# Patient Record
Sex: Male | Born: 1970 | Race: White | Hispanic: No | Marital: Single | Smoking: Current every day smoker
Health system: Southern US, Community
[De-identification: ages and names within clinical notes are randomized; demographics above are authoritative.]

## PROBLEM LIST (undated history)

## (undated) DIAGNOSIS — F39 Unspecified mood [affective] disorder: Secondary | ICD-10-CM

## (undated) DIAGNOSIS — F1910 Other psychoactive substance abuse, uncomplicated: Secondary | ICD-10-CM

## (undated) DIAGNOSIS — K5792 Diverticulitis of intestine, part unspecified, without perforation or abscess without bleeding: Secondary | ICD-10-CM

## (undated) DIAGNOSIS — F1410 Cocaine abuse, uncomplicated: Secondary | ICD-10-CM

## (undated) HISTORY — DX: Other psychoactive substance abuse, uncomplicated: F19.10

## (undated) HISTORY — DX: Cocaine abuse, uncomplicated: F14.10

## (undated) HISTORY — PX: COLOSTOMY: SHX63

---

## 1999-02-16 ENCOUNTER — Emergency Department (HOSPITAL_COMMUNITY): Admission: EM | Admit: 1999-02-16 | Discharge: 1999-02-16 | Payer: Self-pay | Admitting: Emergency Medicine

## 1999-10-07 ENCOUNTER — Emergency Department (HOSPITAL_COMMUNITY): Admission: EM | Admit: 1999-10-07 | Discharge: 1999-10-07 | Payer: Self-pay | Admitting: Emergency Medicine

## 2000-06-02 ENCOUNTER — Emergency Department (HOSPITAL_COMMUNITY): Admission: EM | Admit: 2000-06-02 | Discharge: 2000-06-02 | Payer: Self-pay | Admitting: Emergency Medicine

## 2004-08-14 ENCOUNTER — Ambulatory Visit (HOSPITAL_COMMUNITY): Admission: RE | Admit: 2004-08-14 | Discharge: 2004-08-14 | Payer: Self-pay | Admitting: Surgery

## 2004-10-11 ENCOUNTER — Encounter (INDEPENDENT_AMBULATORY_CARE_PROVIDER_SITE_OTHER): Payer: Self-pay | Admitting: Specialist

## 2004-10-11 ENCOUNTER — Inpatient Hospital Stay (HOSPITAL_COMMUNITY): Admission: RE | Admit: 2004-10-11 | Discharge: 2004-10-18 | Payer: Self-pay | Admitting: Surgery

## 2005-01-23 ENCOUNTER — Ambulatory Visit: Payer: Self-pay | Admitting: Internal Medicine

## 2005-09-04 ENCOUNTER — Emergency Department (HOSPITAL_COMMUNITY): Admission: EM | Admit: 2005-09-04 | Discharge: 2005-09-05 | Payer: Self-pay | Admitting: Emergency Medicine

## 2007-09-09 ENCOUNTER — Emergency Department (HOSPITAL_COMMUNITY): Admission: EM | Admit: 2007-09-09 | Discharge: 2007-09-09 | Payer: Self-pay | Admitting: Emergency Medicine

## 2008-11-12 ENCOUNTER — Emergency Department (HOSPITAL_BASED_OUTPATIENT_CLINIC_OR_DEPARTMENT_OTHER): Admission: EM | Admit: 2008-11-12 | Discharge: 2008-11-12 | Payer: Self-pay | Admitting: Emergency Medicine

## 2010-02-22 ENCOUNTER — Emergency Department (HOSPITAL_COMMUNITY): Admission: EM | Admit: 2010-02-22 | Discharge: 2010-02-22 | Payer: Self-pay | Admitting: Family Medicine

## 2010-06-27 ENCOUNTER — Ambulatory Visit: Payer: Self-pay | Admitting: Internal Medicine

## 2010-06-27 ENCOUNTER — Encounter (INDEPENDENT_AMBULATORY_CARE_PROVIDER_SITE_OTHER): Payer: Self-pay | Admitting: Family Medicine

## 2010-06-27 LAB — CONVERTED CEMR LAB
AST: 29 units/L (ref 0–37)
Albumin: 4.7 g/dL (ref 3.5–5.2)
Alkaline Phosphatase: 72 units/L (ref 39–117)
BUN: 12 mg/dL (ref 6–23)
Basophils Absolute: 0.1 10*3/uL (ref 0.0–0.1)
CO2: 28 meq/L (ref 19–32)
Chloride: 100 meq/L (ref 96–112)
Glucose, Bld: 93 mg/dL (ref 70–99)
HCT: 51 % (ref 39.0–52.0)
Hemoglobin: 16.3 g/dL (ref 13.0–17.0)
Hep B Core Total Ab: NEGATIVE
Hep B S Ab: NEGATIVE
MCV: 98.1 fL (ref 78.0–100.0)
Neutro Abs: 4.3 10*3/uL (ref 1.7–7.7)
Potassium: 4.7 meq/L (ref 3.5–5.3)
RDW: 15.3 % (ref 11.5–15.5)
Total Bilirubin: 0.9 mg/dL (ref 0.3–1.2)
Total CHOL/HDL Ratio: 4.4
Total Protein: 7.5 g/dL (ref 6.0–8.3)
Triglycerides: 74 mg/dL (ref <150)
WBC: 7.2 10*3/uL (ref 4.0–10.5)

## 2010-07-10 ENCOUNTER — Ambulatory Visit: Payer: Self-pay | Admitting: Family Medicine

## 2010-07-10 LAB — CONVERTED CEMR LAB

## 2010-08-06 ENCOUNTER — Ambulatory Visit: Payer: Self-pay | Admitting: Internal Medicine

## 2010-08-08 ENCOUNTER — Ambulatory Visit: Payer: Self-pay | Admitting: Internal Medicine

## 2010-09-09 ENCOUNTER — Emergency Department (HOSPITAL_COMMUNITY): Admission: EM | Admit: 2010-09-09 | Discharge: 2010-09-09 | Payer: Self-pay | Admitting: Emergency Medicine

## 2010-09-09 ENCOUNTER — Inpatient Hospital Stay (HOSPITAL_COMMUNITY): Admission: EM | Admit: 2010-09-09 | Discharge: 2010-09-12 | Payer: Self-pay | Admitting: Emergency Medicine

## 2010-09-30 ENCOUNTER — Encounter (INDEPENDENT_AMBULATORY_CARE_PROVIDER_SITE_OTHER): Payer: Self-pay | Admitting: *Deleted

## 2010-09-30 LAB — CONVERTED CEMR LAB
ALT: 97 units/L — ABNORMAL HIGH (ref 0–53)
AST: 51 units/L — ABNORMAL HIGH (ref 0–37)
Albumin: 4.7 g/dL (ref 3.5–5.2)
Alkaline Phosphatase: 79 units/L (ref 39–117)
BUN: 12 mg/dL (ref 6–23)
Basophils Absolute: 0 10*3/uL (ref 0.0–0.1)
Basophils Relative: 1 % (ref 0–1)
CO2: 30 meq/L (ref 19–32)
Calcium: 10.4 mg/dL (ref 8.4–10.5)
Chloride: 102 meq/L (ref 96–112)
Creatinine, Ser: 0.88 mg/dL (ref 0.40–1.50)
Eosinophils Absolute: 0.2 10*3/uL (ref 0.0–0.7)
Eosinophils Relative: 3 % (ref 0–5)
Glucose, Bld: 72 mg/dL (ref 70–99)
HCT: 49.1 % (ref 39.0–52.0)
Hemoglobin: 16.3 g/dL (ref 13.0–17.0)
Lymphocytes Relative: 38 % (ref 12–46)
Lymphs Abs: 2.2 10*3/uL (ref 0.7–4.0)
MCHC: 33.2 g/dL (ref 30.0–36.0)
MCV: 97.6 fL (ref 78.0–100.0)
Monocytes Absolute: 0.5 10*3/uL (ref 0.1–1.0)
Monocytes Relative: 8 % (ref 3–12)
Neutro Abs: 3 10*3/uL (ref 1.7–7.7)
Neutrophils Relative %: 50 % (ref 43–77)
Platelets: 258 10*3/uL (ref 150–400)
Potassium: 5 meq/L (ref 3.5–5.3)
RBC: 5.03 M/uL (ref 4.22–5.81)
RDW: 13.5 % (ref 11.5–15.5)
Sed Rate: 2 mm/hr (ref 0–16)
Sodium: 141 meq/L (ref 135–145)
Total Bilirubin: 0.6 mg/dL (ref 0.3–1.2)
Total Protein: 7.5 g/dL (ref 6.0–8.3)
WBC: 5.9 10*3/uL (ref 4.0–10.5)

## 2010-10-24 ENCOUNTER — Ambulatory Visit: Payer: Self-pay | Admitting: Gastroenterology

## 2010-11-22 ENCOUNTER — Encounter (INDEPENDENT_AMBULATORY_CARE_PROVIDER_SITE_OTHER): Payer: Self-pay | Admitting: *Deleted

## 2010-11-22 LAB — CONVERTED CEMR LAB
ALT: 78 units/L — ABNORMAL HIGH (ref 0–53)
AST: 40 units/L — ABNORMAL HIGH (ref 0–37)
Albumin: 4.6 g/dL (ref 3.5–5.2)
Amphetamine Screen, Ur: NEGATIVE
BUN: 14 mg/dL (ref 6–23)
Barbiturate Quant, Ur: NEGATIVE
CO2: 27 meq/L (ref 19–32)
Cocaine Metabolites: NEGATIVE
Creatinine, Ser: 0.83 mg/dL (ref 0.40–1.50)
Glucose, Bld: 75 mg/dL (ref 70–99)
Opiate Screen, Urine: NEGATIVE
Potassium: 4.9 meq/L (ref 3.5–5.3)
Propoxyphene: NEGATIVE
Total Bilirubin: 0.6 mg/dL (ref 0.3–1.2)
Total Protein: 7.5 g/dL (ref 6.0–8.3)

## 2011-02-27 ENCOUNTER — Other Ambulatory Visit (HOSPITAL_COMMUNITY): Payer: Self-pay | Admitting: Obstetrics and Gynecology

## 2011-02-27 DIAGNOSIS — B182 Chronic viral hepatitis C: Secondary | ICD-10-CM

## 2011-03-06 LAB — BASIC METABOLIC PANEL
BUN: 34 mg/dL — ABNORMAL HIGH (ref 6–23)
BUN: 38 mg/dL — ABNORMAL HIGH (ref 6–23)
CO2: 20 mEq/L (ref 19–32)
Calcium: 7.2 mg/dL — ABNORMAL LOW (ref 8.4–10.5)
GFR calc Af Amer: 23 mL/min — ABNORMAL LOW (ref >60)
GFR calc non Af Amer: 19 mL/min — ABNORMAL LOW (ref >60)
GFR calc non Af Amer: 31 mL/min — ABNORMAL LOW (ref >60)
Glucose, Bld: 107 mg/dL — ABNORMAL HIGH (ref 70–99)
Glucose, Bld: 110 mg/dL — ABNORMAL HIGH (ref 70–99)
Potassium: 7.4 mEq/L (ref 3.5–5.1)
Sodium: 133 mEq/L — ABNORMAL LOW (ref 135–145)
Sodium: 138 mEq/L (ref 135–145)

## 2011-03-06 LAB — COMPREHENSIVE METABOLIC PANEL
ALT: 2495 U/L — ABNORMAL HIGH (ref 0–53)
ALT: 832 U/L — ABNORMAL HIGH (ref 0–53)
AST: 167 U/L — ABNORMAL HIGH (ref 0–37)
AST: 3654 U/L — ABNORMAL HIGH (ref 0–37)
AST: 482 U/L — ABNORMAL HIGH (ref 0–37)
Albumin: 2.8 g/dL — ABNORMAL LOW (ref 3.5–5.2)
Albumin: 2.9 g/dL — ABNORMAL LOW (ref 3.5–5.2)
Albumin: 3.6 g/dL (ref 3.5–5.2)
BUN: 40 mg/dL — ABNORMAL HIGH (ref 6–23)
CO2: 29 mEq/L (ref 19–32)
Calcium: 7.5 mg/dL — ABNORMAL LOW (ref 8.4–10.5)
Calcium: 8.5 mg/dL (ref 8.4–10.5)
Calcium: 8.7 mg/dL (ref 8.4–10.5)
Creatinine, Ser: 1.12 mg/dL (ref 0.4–1.5)
GFR calc Af Amer: 23 mL/min — ABNORMAL LOW (ref >60)
GFR calc Af Amer: >60 mL/min (ref >60)
GFR calc Af Amer: >60 mL/min (ref >60)
GFR calc non Af Amer: 19 mL/min — ABNORMAL LOW (ref >60)
GFR calc non Af Amer: >60 mL/min (ref >60)
GFR calc non Af Amer: >60 mL/min (ref >60)
Glucose, Bld: 139 mg/dL — ABNORMAL HIGH (ref 70–99)
Potassium: 4.3 mEq/L (ref 3.5–5.1)
Sodium: 144 mEq/L (ref 135–145)
Total Bilirubin: 0.8 mg/dL (ref 0.3–1.2)
Total Protein: 5 g/dL — ABNORMAL LOW (ref 6.0–8.3)

## 2011-03-06 LAB — POCT I-STAT, CHEM 8
Creatinine, Ser: 3.6 mg/dL — ABNORMAL HIGH (ref 0.4–1.5)
Glucose, Bld: 115 mg/dL — ABNORMAL HIGH (ref 70–99)
HCT: 50 % (ref 39.0–52.0)
Sodium: 133 mEq/L — ABNORMAL LOW (ref 135–145)

## 2011-03-06 LAB — POCT CARDIAC MARKERS
Myoglobin, poc: >500 ng/mL (ref 12–200)
Troponin i, poc: 0.4 ng/mL (ref 0.00–0.09)

## 2011-03-06 LAB — URINALYSIS, ROUTINE W REFLEX MICROSCOPIC
Glucose, UA: NEGATIVE mg/dL
Nitrite: NEGATIVE
Protein, ur: 100 mg/dL — AB

## 2011-03-06 LAB — DIFFERENTIAL
Eosinophils Absolute: 0.3 10*3/uL (ref 0.0–0.7)
Eosinophils Relative: 4 % (ref 0–5)
Lymphocytes Relative: 8 % — ABNORMAL LOW (ref 12–46)
Lymphs Abs: 1.3 10*3/uL (ref 0.7–4.0)
Lymphs Abs: 2 10*3/uL (ref 0.7–4.0)
Monocytes Absolute: 0.8 10*3/uL (ref 0.1–1.0)
Monocytes Relative: 10 % (ref 3–12)
Monocytes Relative: 7 % (ref 3–12)
Neutro Abs: 13.3 10*3/uL — ABNORMAL HIGH (ref 1.7–7.7)
Neutrophils Relative %: 84 % — ABNORMAL HIGH (ref 43–77)

## 2011-03-06 LAB — CK TOTAL AND CKMB (NOT AT ARMC)
CK, MB: 27.3 ng/mL (ref 0.3–4.0)
CK, MB: 33.2 ng/mL (ref 0.3–4.0)
Relative Index: 3.8 — ABNORMAL HIGH (ref 0.0–2.5)
Total CK: 1065 U/L — ABNORMAL HIGH (ref 7–232)
Total CK: 1157 U/L — ABNORMAL HIGH (ref 7–232)

## 2011-03-06 LAB — CBC
HCT: 41.4 % (ref 39.0–52.0)
Hemoglobin: 12.5 g/dL — ABNORMAL LOW (ref 13.0–17.0)
Hemoglobin: 12.5 g/dL — ABNORMAL LOW (ref 13.0–17.0)
MCH: 32.5 pg (ref 26.0–34.0)
MCHC: 32.7 g/dL (ref 30.0–36.0)
MCHC: 32.9 g/dL (ref 30.0–36.0)
MCHC: 33.9 g/dL (ref 30.0–36.0)
Platelets: 158 10*3/uL (ref 150–400)
RBC: 4.34 MIL/uL (ref 4.22–5.81)
RDW: 14.4 % (ref 11.5–15.5)
RDW: 14.7 % (ref 11.5–15.5)
RDW: 15.2 % (ref 11.5–15.5)

## 2011-03-06 LAB — VITAMIN D 1,25 DIHYDROXY
Vitamin D 1, 25 (OH)2 Total: 57 pg/mL (ref 18–72)
Vitamin D2 1, 25 (OH)2: <8 pg/mL

## 2011-03-06 LAB — CALCIUM, IONIZED: Calcium, Ion: 1.14 mmol/L (ref 1.12–1.32)

## 2011-03-06 LAB — URINE MICROSCOPIC-ADD ON

## 2011-03-06 LAB — MAGNESIUM: Magnesium: 2.4 mg/dL (ref 1.5–2.5)

## 2011-03-06 LAB — HEPATITIS PANEL, ACUTE
Hep A IgM: NEGATIVE
Hepatitis B Surface Ag: NEGATIVE

## 2011-03-06 LAB — VITAMIN D 25 HYDROXY (VIT D DEFICIENCY, FRACTURES): Vit D, 25-Hydroxy: 50 ng/mL (ref 30–89)

## 2011-03-06 LAB — RAPID URINE DRUG SCREEN, HOSP PERFORMED: Benzodiazepines: NOT DETECTED

## 2011-03-06 LAB — CK: Total CK: 377 U/L — ABNORMAL HIGH (ref 7–232)

## 2011-03-07 ENCOUNTER — Other Ambulatory Visit: Payer: Self-pay | Admitting: Interventional Radiology

## 2011-03-07 ENCOUNTER — Ambulatory Visit (HOSPITAL_COMMUNITY)
Admission: RE | Admit: 2011-03-07 | Discharge: 2011-03-07 | Disposition: A | Discharge status: Home / Self Care | Payer: Self-pay | Source: Ambulatory Visit | Attending: Obstetrics and Gynecology | Admitting: Obstetrics and Gynecology

## 2011-03-07 ENCOUNTER — Ambulatory Visit (HOSPITAL_COMMUNITY): Payer: Self-pay

## 2011-03-07 DIAGNOSIS — Z01812 Encounter for preprocedural laboratory examination: Secondary | ICD-10-CM | POA: Insufficient documentation

## 2011-03-07 DIAGNOSIS — B182 Chronic viral hepatitis C: Secondary | ICD-10-CM | POA: Insufficient documentation

## 2011-03-07 DIAGNOSIS — F17200 Nicotine dependence, unspecified, uncomplicated: Secondary | ICD-10-CM | POA: Insufficient documentation

## 2011-03-07 LAB — CBC
HCT: 46.3 % (ref 39.0–52.0)
Hemoglobin: 15.7 g/dL (ref 13.0–17.0)
MCH: 32.1 pg (ref 26.0–34.0)
MCHC: 33.9 g/dL (ref 30.0–36.0)
MCV: 94.7 fL (ref 78.0–100.0)
RDW: 13.6 % (ref 11.5–15.5)

## 2011-05-09 NOTE — Op Note (Signed)
NAME:  Bobby Morrison, Bobby Morrison NO.:  [id number]   MEDICAL RECORD NO.:  [id number]          PATIENT TYPE:  INP   LOCATION:  NA                           FACILITY:  [facility]   PHYSICIAN:  Currie Paris, M.D.DATE OF BIRTH:  [birth year]   DATE OF PROCEDURE:  10/11/2004  DATE OF DISCHARGE:                                 OPERATIVE REPORT   PREOPERATIVE DIAGNOSIS:  Colostomy, status post sigmoid colectomy and  Hartmann.   POSTOPERATIVE DIAGNOSIS:  Colostomy, status post sigmoid colectomy and  Hartmann.   PROCEDURE:  Takedown of colostomy with primary anastomosis; appendectomy.   SURGEON:  Currie Paris, M.D.   ASSISTANT:  Adolph Pollack, M.D.   ANESTHESIA:  General endotracheal.   INDICATIONS FOR PROCEDURE:  The patient is a 40 year old who apparently had  a perforated diverticulitis operated on elsewhere and presented back for  colostomy takedown.  He is doing well.  It has been several months since his  surgery and has been medically stable.  Preoperative barium study showed no  residual diverticular disease and a fairly lengthy rectal stump.   DESCRIPTION OF PROCEDURE:  The patient was taken to the holding area.  He  had no further questions.  He was taken to the operating room and after  satisfactory general endotracheal anesthesia had been obtained, the abdomen  was clipped, prepped, and draped.  A Foley catheter was placed prior to  doing this, and the colostomy was sutured closed with a pursestring of 2-0  silk.   The abdomen was entered by excising the prior scar and entering the fascia.  A few omental adhesions were taken down.  I immediately freed up the  colostomy and was able to get this up nicely and see the inside where it  came through the abdomen.  Some adhesions to the small bowel to the rectum  stump were taken down.  I freed up the rectal stump to make sure we had  adequate length and that there would be no problems with the  anastomosis,  and this looked fine as well.  At this point, I ran the small bowel, took  down a few more adhesions, and everything else looked okay with that.  The  appendix was noted to be somewhat retrocecal laterally and curved around  itself, and I thought should he get appendicitis, it would be a difficult  appendectomy.  I went ahead at this point and did an appendectomy.   The appendix was grasped and the mesoappendix divided with Kelly clamps and  ligated with a 2-0 silk.  The base of the appendix was crushed with a clamp,  ligated with an 0 chromic, and amputated.  A 3-0 silk was used to invert the  stump, and everything appeared to be dry.   Attention was turned back to the colostomy.  I freed it up a little bit more  and divided it with the GIA, leaving a segment in the abdominal wall to be  removed at the end of the case.   The descending colon was freed up towards the splenic flexure, but I did  not  take down the splenic flexure, as we had plenty of length to reach well into  the pelvis.   The rectal stump was freed up, staying right close to the midline and just  freeing up enough to get a couple of centimeters free.  This all appeared to  be healthy and viable.   Once I had this and I saw that I had plenty of overlap, I decided to staple  the EEA anastomosis as a modified Baker-type anastomosis.   Since I had the rectal stump freed up, I put the pursestring device across  it and placed a 2-0 Prolene through it.  I cut off the excess, and there was  good bleeding of the rectal stump.  I stretched this and sized it and  thought that it would easily admit a 29 stapler, so we opened this and put  the 29 anvil in and tied the pursestring down.  This appeared to be a nice  closure, with a good blood supply.   Attention was turned back to the proximal colon.  The stapled end was cut  off and the end stretched nicely so that I could get the 29 stapler in.  This was inserted  the open end and the spear used to protrude through the  antimesenteric wall of the colon.  This was then attached to the anvil, the  stapler closed down and fired.  This placed a nice wide anastomosis that had  no tension.  It appeared intact looking at it circumferentially from the  outside.   I freed off a little bit more of the mesentery of the distal stump where we  had inserted the EEA, and then using the TA60 was able to fire that across  to close the stump of the colon.  Everything appeared to be intact.  All  staple lines were okay.  The anastomosis was palpated and was free.  There  was no tension.  There was no defect in the mesentery at the close.   The instrument and gloves were changed and the abdomen irrigated.  It was  closed with a #1 Prolene followed by irrigating the skin and closing the  skin with staples.   Attention was turned back to the colostomy.  An elliptical incision was made  around it.  It was elevated out of the fascia and pulled through the  abdominal wall.  The small fascial defect was closed with several sutures of  interrupted 0 Prolene.  The wound was irrigated and closed with staples.   The patient tolerated the procedure well.  Estimated blood loss was 150 cc.  There were no operative complications.  All counts were correct.      CJS/MEDQ  D:  10/11/2004  T:  10/11/2004  Job:  [id number]   cc:   Dala Dock

## 2011-05-09 NOTE — Discharge Summary (Signed)
NAME:  BIANCA, VESTER NO.:  [id number]   MEDICAL RECORD NO.:  [id number]          PATIENT TYPE:  INP   LOCATION:  0452                         FACILITY:  [facility]   PHYSICIAN:  Currie Paris, M.D.DATE OF BIRTH:  Apr 28, 1971   DATE OF ADMISSION:  10/11/2004  DATE OF DISCHARGE:  10/18/2004                                 DISCHARGE SUMMARY   CCS#:  [id number]   FINAL DIAGNOSES:  Colostomy and Hartman status post colectomy for  diverticulitis.   CLINICAL COURSE:  Mr. Marinaro is a 40 year old whose had a partial  colectomy and Hartman closure for diverticulitis several months ago at  another institution. He is admitted now electively for closure of his  colostomy.   HOSPITAL COURSE:  The patient was admitted and taken to the operating room  where his colostomy was taken down and intestinal continuity reestablished.  He tolerated the procedure well. Postoperatively, he had a benign course. He  is maintained on PCA morphine/Dilaudid for postop pain. He was able to start  on clear liquids on the third postoperative day but only gradually was able  to advance to solids. On the fifth postoperative day, removed some of the  staples, the wound was healing okay and by the seventh postoperative day, he  was tolerating a diet, having bowel movements, pain control on oral pain  medications. He had been afebrile. Laboratory studies were stable  postoperatively.   The patient was discharged in satisfactory condition on Tylox for pain,  limited activities, followup in my office in approximately 5-7 days.   Laboratory studies include a hemoglobin of 16.3 prior to admission and 13.1  at discharge.  Liver functions were basically normal. Urinalysis was  negative. Pathology was unremarkable.     Chri   CJS/MEDQ  D:  10/18/2004  T:  10/19/2004  Job:  [id number]   cc:   Tresa Endo L. Philipp Deputy, M.D.  [phone] [address] [place]  [place] [address]  Fax: [phone]

## 2011-07-02 ENCOUNTER — Telehealth: Payer: Self-pay | Admitting: Gastroenterology

## 2011-07-02 NOTE — Telephone Encounter (Signed)
No show to hepatology clinic at [facility].

## 2011-12-01 ENCOUNTER — Encounter: Payer: Self-pay | Admitting: Emergency Medicine

## 2011-12-01 ENCOUNTER — Emergency Department (HOSPITAL_COMMUNITY)
Admission: EM | Admit: 2011-12-01 | Discharge: 2011-12-01 | Disposition: A | Discharge status: Home / Self Care | Payer: Self-pay | Attending: Emergency Medicine | Admitting: Emergency Medicine

## 2011-12-01 DIAGNOSIS — S39012A Strain of muscle, fascia and tendon of lower back, initial encounter: Secondary | ICD-10-CM

## 2011-12-01 DIAGNOSIS — M545 Low back pain, unspecified: Secondary | ICD-10-CM | POA: Insufficient documentation

## 2011-12-01 DIAGNOSIS — S335XXA Sprain of ligaments of lumbar spine, initial encounter: Secondary | ICD-10-CM | POA: Insufficient documentation

## 2011-12-01 DIAGNOSIS — X500XXA Overexertion from strenuous movement or load, initial encounter: Secondary | ICD-10-CM | POA: Insufficient documentation

## 2011-12-01 HISTORY — DX: Diverticulitis of intestine, part unspecified, without perforation or abscess without bleeding: K57.92

## 2011-12-01 MED ORDER — KETOROLAC TROMETHAMINE 60 MG/2ML IM SOLN
60.0000 mg | Freq: Once | INTRAMUSCULAR | Status: AC
  Administered 2011-12-01: 60 mg via INTRAMUSCULAR
  Filled 2011-12-01: qty 2

## 2011-12-01 MED ORDER — IBUPROFEN 800 MG PO TABS: 800.0000 mg | ORAL_TABLET | Freq: Three times a day (TID) | ORAL | Status: AC

## 2011-12-01 MED ORDER — DIAZEPAM 5 MG PO TABS
5.0000 mg | ORAL_TABLET | Freq: Once | ORAL | Status: AC
  Administered 2011-12-01: 5 mg via ORAL
  Filled 2011-12-01: qty 1

## 2011-12-01 MED ORDER — DIAZEPAM 5 MG PO TABS: 5.0000 mg | ORAL_TABLET | Freq: Two times a day (BID) | ORAL | Status: AC

## 2011-12-01 NOTE — ED Provider Notes (Signed)
Medical screening examination/treatment/procedure(s) were performed by non-physician practitioner and as supervising physician I was immediately available for consultation/collaboration.  Flint Melter, MD 12/01/11 [phone]

## 2011-12-01 NOTE — ED Provider Notes (Signed)
History     CSN: [id number] Arrival date & time: 12/01/2011  5:01 PM   First MD Initiated Contact with Patient 12/01/11 1915      Chief Complaint  Patient presents with  . Back Pain    (Consider location/radiation/quality/duration/timing/severity/associated sxs/prior treatment) HPI History provided by pt.   Pt lifted up a car that was stuck this am.  Developed immediate pain across lower back that has recently started to radiate down LLE and has become increasingly more severe.  Denies fever, bladder/bowel dysfunction and lower extremity weakness/parasthesias.  Has been alternating heat/ice but w/out relief.  No prior h/o back injury.    Past Medical History  Diagnosis Date  . Diverticulitis     Past Surgical History  Procedure Date  . Colostomy     History reviewed. No pertinent family history.  History  Substance Use Topics  . Smoking status: Current Everyday Smoker -- 1.0 packs/day    Types: Cigarettes  . Smokeless tobacco: Not on file  . Alcohol Use: Yes      Review of Systems  All other systems reviewed and are negative.    Allergies  Review of patient's allergies indicates no known allergies.  Home Medications   Current Outpatient Rx  Name Route Sig Dispense Refill  . IBUPROFEN 200 MG PO TABS Oral Take 400 mg by mouth every 6 (six) hours as needed. For pain.     Marland Kitchen NIACIN CR 500 MG PO CPCR Oral Take 500 mg by mouth every other day.        BP 112/69  Pulse 82  Temp(Src) 98.8 F (37.1 C) (Oral)  Resp 16  SpO2 99%  Physical Exam  Nursing note and vitals reviewed. Constitutional: He is oriented to person, place, and time. He appears well-developed and well-nourished.       Pt appears uncomfortable w/ position changes  HENT:  Head: Normocephalic and atraumatic.  Eyes:       Normal appearance  Neck: Normal range of motion.  Cardiovascular: Normal rate and regular rhythm.   Pulmonary/Chest: Effort normal and breath sounds normal.    Musculoskeletal:       Lumbar spine non-tender.  Mild tenderness left medial, upper buttock.  Full ROM of LE.  Nml patellar reflexes.  No saddle anesthesia. Distal sensation intact.  2+ DP pulses.   Neurological: He is alert and oriented to person, place, and time.  Skin: Skin is warm and dry. No rash noted.  Psychiatric: He has a normal mood and affect. His behavior is normal.    ED Course  Procedures (including critical care time)  Labs Reviewed - No data to display No results found.   1. Lumbar strain       MDM  Pt presents w/ low back pain that started while lifting a car.  Tenderness left medial upper buttock and pain w/ position change on exam.  No red flag s/sx.  Likely has a muscle strain but could also have a disc herniation.  Will treat conservatively w/ IM toradol and po valium in ED and d/c home w/ ibuprofen and valium + referral to NS for persistent/worsening sx.  Strict return precautions discussed.           Arie Sabina [place], [place] 12/01/11 2016

## 2011-12-01 NOTE — ED Notes (Signed)
Pt states was lifting and pushing on a car, pt states felt back start hurting then, has been icing and applying heat today and is not helping.
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
## 2015-02-13 ENCOUNTER — Other Ambulatory Visit: Payer: Self-pay

## 2015-02-15 ENCOUNTER — Other Ambulatory Visit: Payer: No Typology Code available for payment source

## 2015-02-15 DIAGNOSIS — B182 Chronic viral hepatitis C: Secondary | ICD-10-CM

## 2015-02-15 LAB — IRON: Iron: 149 ug/dL (ref 42–165)

## 2015-02-16 ENCOUNTER — Other Ambulatory Visit: Payer: Self-pay

## 2015-02-16 LAB — PROTIME-INR
INR: 0.99 (ref <1.50)
PROTHROMBIN TIME: 13.1 s (ref 11.6–15.2)

## 2015-02-16 LAB — HEPATITIS B SURFACE ANTIBODY,QUALITATIVE: HEP B S AB: NEGATIVE

## 2015-02-16 LAB — HEPATITIS B SURFACE ANTIGEN: HEP B S AG: NEGATIVE

## 2015-02-16 LAB — ANA: ANA: NEGATIVE

## 2015-02-16 LAB — HEPATITIS A ANTIBODY, TOTAL: Hep A Total Ab: REACTIVE — AB

## 2015-03-14 ENCOUNTER — Ambulatory Visit: Payer: No Typology Code available for payment source | Admitting: Internal Medicine

## 2015-04-02 ENCOUNTER — Encounter: Payer: Self-pay | Admitting: Internal Medicine

## 2015-04-02 ENCOUNTER — Ambulatory Visit (INDEPENDENT_AMBULATORY_CARE_PROVIDER_SITE_OTHER): Payer: No Typology Code available for payment source | Admitting: Internal Medicine

## 2015-04-02 VITALS — BP 125/77 | HR 91 | Temp 97.3°F | Wt 163.0 lb

## 2015-04-02 DIAGNOSIS — Z23 Encounter for immunization: Secondary | ICD-10-CM

## 2015-04-02 DIAGNOSIS — B182 Chronic viral hepatitis C: Secondary | ICD-10-CM

## 2015-04-02 NOTE — Progress Notes (Signed)
+Bobby Morrison is a 44 y.o. male who presents for initial evaluation and management of a positive Hepatitis C antibody test.  Patient tested positive 5 years ago. Hepatitis C risk factors present are: IV drug abuse (details: last used 11 months ago). Patient denies history of blood transfusion. Patient has had other studies performed. Results: hepatitis C RNA by PCR, result: positive. Patient has not had prior treatment for Hepatitis C. Patient does not have a past history of liver disease. Patient does not have a family history of liver disease.   HPI: He has been free of drugs and alcohol now for 11 months.  Is taking Trileptal which is contraindicated with all regimens.  Had a liver biopsy for evaluation in 2011 which was F0/A2.  Did not pursue treatment at the time.   Patient does have documented immunity to Hepatitis A. Patient does not have documented immunity to Hepatitis B.     Review of Systems A comprehensive review of systems was negative.   Past Medical History  Diagnosis Date  . Diverticulitis     Prior to Admission medications   Medication Sig Start Date End Date Taking? Authorizing Provider  hydrOXYzine (ATARAX/VISTARIL) 25 MG tablet Take 25 mg by mouth at bedtime as needed.   Yes Historical Provider, MD  ibuprofen (ADVIL,MOTRIN) 200 MG tablet Take 400 mg by mouth every 6 (six) hours as needed. For pain.    Yes Historical Provider, MD  Oxcarbazepine (TRILEPTAL) 300 MG tablet Take 300 mg by mouth 3 (three) times daily.   Yes Historical Provider, MD  risperiDONE (RISPERDAL) 0.5 MG tablet Take 1 mg by mouth daily.   Yes Historical Provider, MD  venlafaxine XR (EFFEXOR-XR) 75 MG 24 hr capsule Take 75 mg by mouth daily with breakfast.   Yes Historical Provider, MD  niacin 500 MG CR capsule Take 500 mg by mouth every other day.      Historical Provider, MD    No Known Allergies  History  Substance Use Topics  . Smoking status: Current Every Day Smoker -- 1.00 packs/day   Types: Cigarettes  . Smokeless tobacco: Not on file  . Alcohol Use: Yes    No family history on file.    Objective:   Filed Vitals:   04/02/15 1613  BP: 125/77  Pulse: 91  Temp: 97.3 F (36.3 C)   in no apparent distress and alert HEENT: anicteric Cor RRR and No murmurs clear Bowel sounds are normal, liver is not enlarged, spleen is not enlarged peripheral pulses normal, no pedal edema, no clubbing or cyanosis negative for - jaundice, spider hemangioma, telangiectasia, palmar erythema, ecchymosis and atrophy  Laboratory Genotype:  Lab Results  Component Value Date   HCVGENOTYPE 1a 07/10/2010   HCV viral load:  Lab Results  Component Value Date   HCVQUANT 2600000* 07/10/2010   Lab Results  Component Value Date   WBC 5.5 03/07/2011   HGB 15.7 03/07/2011   HCT 46.3 03/07/2011   MCV 94.7 03/07/2011   PLT 214 03/07/2011    Lab Results  Component Value Date   CREATININE 0.83 11/22/2010   BUN 14 11/22/2010   NA 138 11/22/2010   K 4.9 11/22/2010   CL 102 11/22/2010   CO2 27 11/22/2010    Lab Results  Component Value Date   ALT 78* 11/22/2010   AST 40* 11/22/2010   ALKPHOS 79 11/22/2010   BILITOT 0.6 11/22/2010   INR 0.99 02/15/2015      Assessment: Chronic Hepatitis C genotype  1a  Plan: 1) Patient counseled extensively on limiting acetaminophen to no more than 2 grams daily, avoidance of alcohol. 2) Transmission discussed with patient including sexual transmission, sharing razors and toothbrush.   3) Will need referral to gastroenterology if concern for cirrhosis 4) Will need referral for substance abuse counseling: No. He is already in treatment 5) Will need to see what medications may be possible with oxcarbazepine or if possible to change that medication.   6) Hepatitis A vaccine No. 7) Hepatitis B vaccine Yes.   8) Pneumovax vaccine if concern for cirrhosis 9) will follow up after elastography

## 2015-04-30 ENCOUNTER — Telehealth: Payer: Self-pay | Admitting: *Deleted

## 2015-04-30 NOTE — Telephone Encounter (Signed)
Patient called for his elastography and follow up appointments.  He is scheduled for elastography Tuesday, June 7 9:00 (8:45 [facility].  He is to have nothing to eat or drink for 8 hours prior to the procedure.  Patient will need follow up scheduled with Dr Luciana Axeomer.  Andree CossHowell, Mette Southgate M, RN

## 2015-05-22 ENCOUNTER — Other Ambulatory Visit: Payer: No Typology Code available for payment source

## 2015-05-28 ENCOUNTER — Telehealth (HOSPITAL_COMMUNITY): Payer: Self-pay

## 2015-05-28 NOTE — Telephone Encounter (Signed)
Call[facility] pt to [facility]..the patient agreed to arrive at 8:30 and stay npo 6 hours prior to exam. AW

## 2015-05-29 ENCOUNTER — Ambulatory Visit: Payer: No Typology Code available for payment source | Admitting: Internal Medicine

## 2015-05-29 ENCOUNTER — Ambulatory Visit (HOSPITAL_COMMUNITY)
Admission: RE | Admit: 2015-05-29 | Discharge: 2015-05-29 | Disposition: A | Discharge status: Home / Self Care | Payer: No Typology Code available for payment source | Source: Ambulatory Visit | Attending: Internal Medicine | Admitting: Internal Medicine

## 2015-05-29 DIAGNOSIS — K824 Cholesterolosis of gallbladder: Secondary | ICD-10-CM | POA: Insufficient documentation

## 2015-05-29 DIAGNOSIS — N281 Cyst of kidney, acquired: Secondary | ICD-10-CM | POA: Insufficient documentation

## 2015-05-29 DIAGNOSIS — B182 Chronic viral hepatitis C: Secondary | ICD-10-CM | POA: Insufficient documentation

## 2015-06-24 ENCOUNTER — Emergency Department (INDEPENDENT_AMBULATORY_CARE_PROVIDER_SITE_OTHER)
Admission: EM | Admit: 2015-06-24 | Discharge: 2015-06-24 | Disposition: A | Discharge status: Home / Self Care | Payer: No Typology Code available for payment source | Source: Home / Self Care | Attending: Emergency Medicine | Admitting: Emergency Medicine

## 2015-06-24 ENCOUNTER — Encounter (HOSPITAL_COMMUNITY): Payer: Self-pay | Admitting: Emergency Medicine

## 2015-06-24 DIAGNOSIS — L821 Other seborrheic keratosis: Secondary | ICD-10-CM

## 2015-06-24 NOTE — ED Provider Notes (Signed)
CSN: [id number]     Arrival date & time 06/24/15  1311 History   First MD Initiated Contact with Patient 06/24/15 1445     Chief Complaint  Patient presents with  . Nevus   (Consider location/radiation/quality/duration/timing/severity/associated sxs/prior Treatment) HPI  He is a 44 year old man here for evaluation of skin lesions. 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 He does not wear sunscreen. He has several spots of the been present for anywhere from 5-10 years. They sometimes get irritated and itchy. Some of them have gotten larger, and he has developed new spots.  Past Medical History  Diagnosis Date  . Diverticulitis    Past Surgical History  Procedure Laterality Date  . Colostomy     No family history on file. History  Substance Use Topics  . Smoking status: Current Every Day Smoker -- 1.00 packs/day    Types: Cigarettes  . Smokeless tobacco: Not on file  . Alcohol Use: Yes    Review of Systems As in history of present illness Allergies  Review of patient's allergies indicates no known allergies.  Home Medications   Prior to Admission medications   Medication Sig Start Date End Date Taking? Authorizing Provider  hydrOXYzine (ATARAX/VISTARIL) 25 MG tablet Take 25 mg by mouth at bedtime as needed.   Yes Historical Provider, MD  risperiDONE (RISPERDAL) 0.5 MG tablet Take 1 mg by mouth daily.   Yes Historical Provider, MD  venlafaxine XR (EFFEXOR-XR) 75 MG 24 hr capsule Take 75 mg by mouth daily with breakfast.   Yes Historical Provider, MD  ibuprofen (ADVIL,MOTRIN) 200 MG tablet Take 400 mg by mouth every 6 (six) hours as needed. For pain.     Historical Provider, MD  niacin 500 MG CR capsule Take 500 mg by mouth every other day.      Historical Provider, MD  Oxcarbazepine (TRILEPTAL) 300 MG tablet Take 300 mg by mouth 3 (three) times daily.    Historical Provider, MD   There were no vitals taken for this visit.  Vitals were reviewed and are  within normal limits. Physical Exam  Constitutional: He is oriented to person, place, and time. He appears well-developed and well-nourished. No distress.  Cardiovascular: Normal rate.   Pulmonary/Chest: Effort normal.  Neurological: He is alert and oriented to person, place, and time.  Skin:     Separate keratoses marked. He also has diffuse sun related changes. No obviously cancerous lesions.    ED Course  Procedures (including critical care time) Labs Review Labs Reviewed - No data to display  Imaging Review No results found.   MDM   1. Seborrheic keratoses    Discussed that these are benign lesions. Handout given. Provided contact information for the family medicine center to see if he can be seen in their dermatology clinic as he has the orange card. I also provided information for Dr. Terri PiedraLupton in dermatology. Follow up as needed.    Charm RingsErin J Isais Klipfel, MD 06/24/15 [id number]

## 2015-06-24 NOTE — Discharge Instructions (Signed)
The spots you have are called seborrheic keratoses. These are not cancerous.  Please try calling the family medicine center at [phone] to see if you can be seen in their dermatology clinic. You may need to establish care there first. You can also try Dr. Terri PiedraLupton, [facility] dermatologist [facility]. I'm not sure how much it would cost to see him.  Follow-up as needed.   Seborrheic Keratosis Seborrheic keratosis is a common, noncancerous (benign) skin growth that can occur anywhere on the skin.It looks like "stuck-on," waxy, rough, tan, brown, or black spots on the skin. These skin growths can be flat or raised.They are often called "barnacles" because of their pasted-on appearance.Usually, these skin growths appear in adulthood, around age 44, and increase in number as you age. They may also develop during pregnancy or following estrogen therapy. Many people may only have one growth appear in their lifetime, while some people may develop many growths. CAUSES It is unknown what causes these skin growths, but they appear to run in families. SYMPTOMS Seborrheic keratosis is often located on the face, chest, shoulders, back, or other areas. These growths are:  Usually painless, but may become irritated and itchy.  Yellow, brown, black, or other colors.  Slightly raised or have a flat surface.  Sometimes rough or wart-like in texture.  Often waxy on the surface.  Round or oval-shaped.  Sometimes "stuck-on" in appearance.  Sometimes single, but there are usually many growths. Any growth that bleeds, itches on a regular basis, becomes inflamed, or becomes irritated needs to be evaluated by a skin specialist (dermatologist). DIAGNOSIS Diagnosis is mainly based on the way the growths appear. In some cases, it can be difficult to tell this type of skin growth from skin cancer. A skin growth tissue sample (biopsy) may be used to confirm the diagnosis. TREATMENT Most often, treatment is not  needed because the skin growths are benign.If the skin growth is irritated easily by clothing or jewelry, causing it to scab or bleed, treatment may be recommended. Patients may also choose to have the growths removed because they do not like their appearance. Most commonly, these growths are treated with cryosurgery. In cryosurgery, liquid nitrogen is applied to "freeze" the growth. The growth usually falls off within a matter of days. A blister may form and dry into a scab that will also fall off. After the growth or scab falls off, it may leave a dark or light spot on the skin. This color may fade over time, or it may remain permanent on the skin. HOME CARE INSTRUCTIONS If the skin growths are treated with cryosurgery, the treated area needs to be kept clean with water and soap. SEEK MEDICAL CARE IF:  You have questions about these growths or other skin problems.  You develop new symptoms, including:  A change in the appearance of the skin growth.  New growths.  Any bleeding, itching, or pain in the growths.  A skin growth that looks similar to seborrheic keratosis. Document Released: 01/10/2011 Document Revised: 03/01/2012 Document Reviewed: 01/10/2011 [facility] Patient Information 2015 [employer] [place] [place]. This information is not intended to replace advice given to you by your health care provider. Make sure you discuss any questions you have with your health care provider.

## 2015-06-24 NOTE — ED Notes (Signed)
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  Alert, no signs of acute distress.

## 2015-09-27 ENCOUNTER — Other Ambulatory Visit: Payer: No Typology Code available for payment source

## 2015-09-27 DIAGNOSIS — B182 Chronic viral hepatitis C: Secondary | ICD-10-CM

## 2015-10-03 LAB — HCV RNA NS5A DRUG RESISTANCE

## 2015-11-06 ENCOUNTER — Encounter: Payer: Self-pay | Admitting: Pharmacy Technician

## 2015-11-09 ENCOUNTER — Other Ambulatory Visit: Payer: Self-pay | Admitting: Internal Medicine

## 2015-11-09 MED ORDER — ELBASVIR-GRAZOPREVIR 50-100 MG PO TABS: 1.0000 | ORAL_TABLET | Freq: Every day | ORAL | Status: DC

## 2015-11-13 ENCOUNTER — Encounter: Payer: Self-pay | Admitting: Pharmacy Technician

## 2015-11-26 ENCOUNTER — Ambulatory Visit: Payer: Self-pay | Admitting: Pharmacist Clinician (PhC)/ Clinical Pharmacy Specialist

## 2015-11-26 ENCOUNTER — Telehealth: Payer: Self-pay | Admitting: *Deleted

## 2015-11-26 DIAGNOSIS — B182 Chronic viral hepatitis C: Secondary | ICD-10-CM

## 2015-11-26 NOTE — Progress Notes (Signed)
Patient ID: Bobby Morrison, male   DOB: 01/02/1971, 44 y.o.   MRN: [id number] HPI: Bobby KentJames Schwake is a 44 y.o. male who is here after developing some dizziness with his meds.   Lab Results  Component Value Date   HCVGENOTYPE 1a 07/10/2010    Allergies: No Known Allergies  Vitals:    Past Medical History: Past Medical History  Diagnosis Date  . Diverticulitis     Social History: Social History   Social History  . Marital Status: Single    Spouse Name: N/A  . Number of Children: N/A  . Years of Education: N/A   Social History Main Topics  . Smoking status: Current Every Day Smoker -- 1.00 packs/day    Types: Cigarettes  . Smokeless tobacco: Not on file  . Alcohol Use: Yes  . Drug Use: No  . Sexual Activity: Not on file   Other Topics Concern  . Not on file   Social History Narrative    Labs: HEP B S AB (no units)  Date Value  02/15/2015 NEG   HEPATITIS B SURFACE AG (no units)  Date Value  02/15/2015 NEGATIVE   HCV AB (no units)  Date Value  09/11/2010 *   Reactive (NOTE) Result repeated and verified.                                                                       This test is for screening purposes only.  Reactive results should be confirmed by an alternative method.  Suggest HCV Qualitative, PCR, test code  [id number].  Specimens will be stable for reflex testing up to 3 days after collection.    Lab Results  Component Value Date   HCVGENOTYPE 1a 07/10/2010    Hepatitis C RNA quantitative Latest Ref Rng 07/10/2010  HCV Quantitative <43 intl units/mL 2600000(H)    AST  Date Value  11/22/2010 40 units/L*  09/30/2010 51 units/L*  09/12/2010 167 U/L*   ALT  Date Value  11/22/2010 78 units/L*  09/30/2010 97 units/L*  09/12/2010 832 U/L*   INR (no units)  Date Value  02/15/2015 0.99  03/07/2011 0.99    CrCl: CrCl cannot be calculated (Unknown ideal weight.).  Fibrosis Score: F0/F1 as assessed by AR[facility]  Child-Pugh Score: Class  A  Previous Treatment Regimen: Naive  Assessment: Bobby Morrison started on his Zepatier (NS5A neg) around 4 days ago and developed some vertigo/dizziness. I'm not sure if this due to the Zepatier or the Lamictal that he was recently switched to from Trileptal in order to treat his Hep C. Told him to go get some meclizine and take it BID as needed for dizziness. He stated that he is going to do so. He also stated that he had some vision disturbances. More like nystagmus but it doesn't look like it today. This is more likely related to his Lamictal than Zepatier. Explained that to him. Told him to use PRN ibuprofen for headache.   Recommendations: Cont Zepatier 1 PO qday Meclizine 25mg  PO BID PRN dizziness VL in 4 wks  Ulyses Southwardham, Minh BloomingdaleQuang, Vermont[place]arm[place]., BCPS, AAHIVP Clinical Infectious Disease Pharmacist [facility] 11/26/2015, 2:13 PM

## 2015-11-26 NOTE — Telephone Encounter (Signed)
Patient calling 1) to return Betty's phone call and 2) to speak with someone regarding potential side effects - he is reporting some dizziness.  Phone message relayed to Cammy CopaMinh Pham.  Dquan Cortopassi, Zachary GeorgeMichelle M, RN

## 2015-11-27 ENCOUNTER — Ambulatory Visit: Payer: Self-pay

## 2015-12-06 ENCOUNTER — Ambulatory Visit: Payer: Self-pay | Admitting: Pharmacist Clinician (PhC)/ Clinical Pharmacy Specialist

## 2015-12-06 NOTE — Progress Notes (Signed)
Patient ID: Reino KentJames Surges, male   DOB: 05/27/1971, 44 y.o.   MRN: [id number] HPI: Reino KentJames Polanco is a 44 y.o. male who is here for his 2 wks f/u after starting his hep C meds.   Lab Results  Component Value Date   HCVGENOTYPE 1a 07/10/2010    Allergies: No Known Allergies  Vitals:    Past Medical History: Past Medical History  Diagnosis Date  . Diverticulitis     Social History: Social History   Social History  . Marital Status: Single    Spouse Name: N/A  . Number of Children: N/A  . Years of Education: N/A   Social History Main Topics  . Smoking status: Current Every Day Smoker -- 1.00 packs/day    Types: Cigarettes  . Smokeless tobacco: Not on file  . Alcohol Use: Yes  . Drug Use: No  . Sexual Activity: Not on file   Other Topics Concern  . Not on file   Social History Narrative    Labs: HEP B S AB (no units)  Date Value  02/15/2015 NEG   HEPATITIS B SURFACE AG (no units)  Date Value  02/15/2015 NEGATIVE   HCV AB (no units)  Date Value  09/11/2010 *   Reactive (NOTE) Result repeated and verified.                                                                       This test is for screening purposes only.  Reactive results should be confirmed by an alternative method.  Suggest HCV Qualitative, PCR, test code  [id number].  Specimens will be stable for reflex testing up to 3 days after collection.    Lab Results  Component Value Date   HCVGENOTYPE 1a 07/10/2010    Hepatitis C RNA quantitative Latest Ref Rng 07/10/2010  HCV Quantitative <43 intl units/mL 2600000(H)    AST  Date Value  11/22/2010 40 units/L*  09/30/2010 51 units/L*  09/12/2010 167 U/L*   ALT  Date Value  11/22/2010 78 units/L*  09/30/2010 97 units/L*  09/12/2010 832 U/L*   INR (no units)  Date Value  02/15/2015 0.99  03/07/2011 0.99    CrCl: CrCl cannot be calculated (Unknown ideal weight.).  Fibrosis Score: F0/1 as assessed by ARFI  Child-Pugh Score: Class  A  Previous Treatment Regimen: Naive  Assessment: Fayrene FearingJames started Zepatier on 12/2. He came in about 10 days ago due to dizziness after starting the med. We recommended meclizine for it. He stated that it's completely gone now and he no longer needed and only use PRN if it comes back. He has missed 1 dose of the medication since he took it at night and fell as sleep. I reinforced it to him to never miss doses of this therapy. He will consider taking it in AM instead. He is going to come back for lab in 2 wks. Since Dr. Ephriam Knucklesomer's schedule is full, he'll f/u in late Feb with him and that should be fine.   Recommendations:  Cont Zepatier 1 PO qday Meclizine 25 mg BID PRN VL in 2 wks F/u with Dr Luciana Axeomer in Feb  Ulyses Southwardham, Nevaeh Casillas OlivetQuang, [place]arm.D., BCPS, AAHIVP Clinical Infectious Disease Pharmacist [facility] 12/06/2015, 9:43 AM

## 2015-12-28 ENCOUNTER — Other Ambulatory Visit: Payer: Self-pay

## 2015-12-28 DIAGNOSIS — B182 Chronic viral hepatitis C: Secondary | ICD-10-CM

## 2016-01-01 LAB — HEPATITIS C RNA QUANTITATIVE: HCV Quantitative: NOT DETECTED IU/mL (ref <15)

## 2016-01-10 ENCOUNTER — Encounter: Payer: Self-pay | Admitting: Pharmacist Clinician (PhC)/ Clinical Pharmacy Specialist

## 2016-02-19 ENCOUNTER — Ambulatory Visit (INDEPENDENT_AMBULATORY_CARE_PROVIDER_SITE_OTHER): Payer: Self-pay | Admitting: Internal Medicine

## 2016-02-19 ENCOUNTER — Encounter: Payer: Self-pay | Admitting: Internal Medicine

## 2016-02-19 VITALS — BP 132/90 | HR 66 | Temp 97.7°F | Ht 64.0 in | Wt 167.0 lb

## 2016-02-19 DIAGNOSIS — K7400 Hepatic fibrosis, unspecified: Secondary | ICD-10-CM | POA: Insufficient documentation

## 2016-02-19 DIAGNOSIS — Z23 Encounter for immunization: Secondary | ICD-10-CM

## 2016-02-19 DIAGNOSIS — F329 Major depressive disorder, single episode, unspecified: Secondary | ICD-10-CM

## 2016-02-19 DIAGNOSIS — B182 Chronic viral hepatitis C: Secondary | ICD-10-CM

## 2016-02-19 DIAGNOSIS — F32A Depression, unspecified: Secondary | ICD-10-CM

## 2016-02-19 DIAGNOSIS — F1910 Other psychoactive substance abuse, uncomplicated: Secondary | ICD-10-CM | POA: Insufficient documentation

## 2016-02-19 NOTE — Progress Notes (Signed)
   Subjective:    Patient ID: Dorrance Sellick, male    DOB: 1971/02/26, 45 y.o.   MRN: [id number]  HPI Here for follow up of HCV.   Has gentoype 1a, elastography with F0/1, was on carbamezapine and switched to lamotrigine and started Zepatier.  Now has finished  Early viral load undetectable.  Some fatigue.  Previous biopsy F0/A2 in 2011 by Dr. Foy Guadalajara.  Hepatitis B non immune and had #1 of series.  Pleased with getting medication.    Review of Systems  Constitutional: Positive for fatigue.  Gastrointestinal: Negative for nausea.  Skin: Negative for rash.  Neurological: Negative for dizziness and headaches.       Objective:   Physical Exam  Constitutional: He appears well-developed and well-nourished. No distress.  Eyes: No scleral icterus.  Cardiovascular: Normal rate, regular rhythm and normal heart sounds.   No murmur heard. Pulmonary/Chest: Effort normal and breath sounds normal. No respiratory distress. He has no wheezes.    Social History   Social History  . Marital Status: Single    Spouse Name: N/A  . Number of Children: N/A  . Years of Education: N/A   Occupational History  . Not on file.   Social History Main Topics  . Smoking status: Current Every Day Smoker -- 1.00 packs/day    Types: Cigarettes  . Smokeless tobacco: Not on file  . Alcohol Use: No  . Drug Use: No  . Sexual Activity: Not on file   Other Topics Concern  . Not on file   Social History Narrative        Assessment & Plan:

## 2016-02-19 NOTE — Assessment & Plan Note (Signed)
Continues to remain clean and sober.

## 2016-02-19 NOTE — Assessment & Plan Note (Addendum)
Now completed.  End of treatment lab today and rtc 4 months for SVR12.   Hepatitis B #2 today.

## 2016-02-19 NOTE — Assessment & Plan Note (Signed)
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  No HCC screening indicated.

## 2016-02-19 NOTE — Assessment & Plan Note (Signed)
He is ok to go back to Trileptal now that he has completed Zepatier.

## 2016-02-20 LAB — HEPATITIS C RNA QUANTITATIVE: HCV QUANT: NOT DETECTED [IU]/mL (ref <15)

## 2016-02-22 NOTE — Addendum Note (Signed)
Addended by: Andree CossHOWELL, Kathlyn Leachman M on: 02/22/2016 12:39 PM   Modules accepted: Orders

## 2016-04-21 ENCOUNTER — Encounter (HOSPITAL_COMMUNITY): Payer: Self-pay | Admitting: *Deleted

## 2016-04-21 ENCOUNTER — Ambulatory Visit (HOSPITAL_COMMUNITY)
Admission: EM | Admit: 2016-04-21 | Discharge: 2016-04-21 | Disposition: A | Discharge status: Home / Self Care | Payer: Self-pay | Attending: Emergency Medicine | Admitting: Emergency Medicine

## 2016-04-21 DIAGNOSIS — S61012A Laceration without foreign body of left thumb without damage to nail, initial encounter: Secondary | ICD-10-CM

## 2016-04-21 DIAGNOSIS — Z23 Encounter for immunization: Secondary | ICD-10-CM

## 2016-04-21 DIAGNOSIS — S61002A Unspecified open wound of left thumb without damage to nail, initial encounter: Secondary | ICD-10-CM

## 2016-04-21 HISTORY — DX: Unspecified mood (affective) disorder: F39

## 2016-04-21 MED ORDER — LIDOCAINE HCL 2 % IJ SOLN
INTRAMUSCULAR | Status: AC
  Filled 2016-04-21: qty 20

## 2016-04-21 MED ORDER — TETANUS-DIPHTHERIA TOXOIDS TD 5-2 LFU IM INJ
0.5000 mL | INJECTION | Freq: Once | INTRAMUSCULAR | Status: AC
  Administered 2016-04-21: 0.5 mL via INTRAMUSCULAR

## 2016-04-21 MED ORDER — LIDOCAINE HCL (PF) 2 % IJ SOLN
10.0000 mL | Freq: Once | INTRAMUSCULAR | Status: AC
  Administered 2016-04-21: 10 mL

## 2016-04-21 MED ORDER — IBUPROFEN 800 MG PO TABS: 800.0000 mg | ORAL_TABLET | Freq: Three times a day (TID) | ORAL | Status: AC | PRN

## 2016-04-21 MED ORDER — TETANUS-DIPHTHERIA TOXOIDS TD 5-2 LFU IM INJ
INJECTION | INTRAMUSCULAR | Status: AC
  Filled 2016-04-21: qty 0.5

## 2016-04-21 NOTE — ED Provider Notes (Signed)
HPI  SUBJECTIVE:  Bobby Morrison is a right handed  45 y.o. male who presents with laceration to dorsal aspect of his left thumb. States that actually, so with a razor blade. This happened immediately prior to arrival. He triedperoxide, pressure dressing with hemostasis. He has also applied Steri-Strips. There are no aggravating or alleviating factors. He reports mild tingling, but states that this is resolving. No numbness, foreign body sensation, weakness. He has minimal pain now.Patient is a smoker. Past medical history negative for diabetes, hypertension. Per chart review, has a history of hepatitis C, substance abuse. PMD: Dr. Chales AbrahamsMary Ann Placey. Tetanus unknown.   Past Medical History  Diagnosis Date  . Diverticulitis   . Mood disorder [facility](HCC)     Past Surgical History  Procedure Laterality Date  . Colostomy      History reviewed. No pertinent family history.  Social History  Substance Use Topics  . Smoking status: Current Every Day Smoker -- 1.00 packs/day    Types: Cigarettes  . Smokeless tobacco: None  . Alcohol Use: No    No current facility-administered medications for this encounter.  Current outpatient prescriptions:  .  Oxcarbazepine (TRILEPTAL) 300 MG tablet, Take 300 mg by mouth 2 (two) times daily., Disp: , Rfl:  .  risperiDONE (RISPERDAL) 0.5 MG tablet, Take 1 mg by mouth daily., Disp: , Rfl:  .  venlafaxine XR (EFFEXOR-XR) 75 MG 24 hr capsule, Take 75 mg by mouth daily with breakfast., Disp: , Rfl:  .  ibuprofen (ADVIL,MOTRIN) 800 MG tablet, Take 1 tablet (800 mg total) by mouth every 8 (eight) hours as needed., Disp: 30 tablet, Rfl: 0 .  lamoTRIgine (LAMICTAL) 25 MG tablet, Take 25 mg by mouth daily., Disp: , Rfl:   No Known Allergies   ROS  As noted in HPI.   Physical Exam  BP 130/98 mmHg  Pulse 71  Temp(Src) 98.3 F (36.8 C) (Oral)  Resp 17  SpO2 100%  Constitutional: Well developed, well nourished, no acute distress Eyes:  EOMI, conjunctiva  normal bilaterally HENT: Normocephalic, atraumatic,mucus membranes moist Respiratory: Normal inspiratory effort Cardiovascular: Normal rate GI: nondistended skin: No rash, skin intact Musculoskeletal: 2 cm clean linear laceration dorsal aspect of left thumb. See picture. Wound explored through full range of motion with adequate hemostasis. No neurovascular involvement or foreign body noted. Refill distally less than 2 seconds, joint stable in varus/valgus stress, patient able to flex/extend thumb against resistance at the MP and PIP. 2 point discrimination intact distally Over entire thumb    Neurologic: Alert & oriented x 3, no focal neuro deficits Psychiatric: Speech and behavior appropriate   ED Course   Medications  tetanus & diphtheria toxoids (adult) (TENIVAC) injection 0.5 mL (0.5 mLs Intramuscular Given 04/21/16 1355)  lidocaine (XYLOCAINE) 2 % injection 10 mL (10 mLs Other Given 04/21/16 1355)    No orders of the defined types were placed in this encounter.    No results found for this or any previous visit (from the past 24 hour(s)). No results found.  ED Clinical Impression  Laceration of thumb, left, initial encounter   ED Assessment/Plan  Updating tetanus. Procedure note: Washed the wound extensively with chlorhexidine and tap water. Anesthetized the area with 1.5 cc of plain lidocaine 2% with adequate anesthesia. Placed 4 5-0 ethilon interrupted sutures. Area was then dressed with bacitracin, pressure dressing, splinted in extension. Patient tolerated procedure well.  home with ibuprofen.  Follow up here in 8-10 days to have sutures removed. Return sooner for  any signs of infection. Patient agrees with plan.  *This clinic note was created using Dragon dictation software. Therefore, there may be occasional mistakes despite careful proofreading.  ?   Domenick Gong, MD 04/22/16 1241

## 2016-04-21 NOTE — Discharge Instructions (Signed)
Chart here in 10 days for suture removal. Return sooner for any signs of infection. Go to the ER if your finger turns blue, white, or pain not controlled with medications.

## 2016-04-21 NOTE — ED Notes (Signed)
Patient with laceration to left thumb. Reports was working at home and razor blade went into nail bed and down medial aspect of thumb. Steri strips in place. Bleeding controlled. Patient reports tingling but denies numbness.

## 2016-06-18 ENCOUNTER — Encounter: Payer: Self-pay | Admitting: Internal Medicine

## 2016-06-18 ENCOUNTER — Ambulatory Visit (INDEPENDENT_AMBULATORY_CARE_PROVIDER_SITE_OTHER): Payer: Self-pay | Admitting: Internal Medicine

## 2016-06-18 VITALS — BP 128/78 | HR 76 | Temp 98.5°F | Ht 65.0 in | Wt 165.0 lb

## 2016-06-18 DIAGNOSIS — F32A Depression, unspecified: Secondary | ICD-10-CM

## 2016-06-18 DIAGNOSIS — K7400 Hepatic fibrosis, unspecified: Secondary | ICD-10-CM

## 2016-06-18 DIAGNOSIS — F329 Major depressive disorder, single episode, unspecified: Secondary | ICD-10-CM

## 2016-06-18 DIAGNOSIS — B182 Chronic viral hepatitis C: Secondary | ICD-10-CM

## 2016-06-18 MED ORDER — VALACYCLOVIR HCL 1 G PO TABS: 1000.0000 mg | ORAL_TABLET | Freq: Two times a day (BID) | ORAL | Status: AC

## 2016-06-20 LAB — HEPATITIS C RNA QUANTITATIVE: HCV Quantitative: NOT DETECTED IU/mL (ref <15)

## 2016-06-20 NOTE — Assessment & Plan Note (Signed)
Last level checked and now considered cured!  No follow up needed.

## 2016-06-20 NOTE — Progress Notes (Signed)
   Subjective:    Patient ID: Bobby Morrison, male    DOB: 06/14/1971, 45 y.o.   MRN: [id number]  HPI Here for follow up of HCV.   Has gentoype 1a, elastography with F0/1, was on carbamezapine and switched to lamotrigine and then completed Zepatier.   Previous biopsy F0/A2 in 2011 by Dr. Foy GuadalajaraFried.  Hepatitis B non immune and had #1 of series.  Pleased with getting medication. End of treatment was negative.     Review of Systems  Constitutional: Negative for fatigue.  Gastrointestinal: Negative for nausea.  Skin: Negative for rash.  Neurological: Negative for dizziness and headaches.       Objective:   Physical Exam  Constitutional: He appears well-developed and well-nourished. No distress.  Eyes: No scleral icterus.  Cardiovascular: Normal rate, regular rhythm and normal heart sounds.   No murmur heard. Pulmonary/Chest: Effort normal and breath sounds normal. No respiratory distress. He has no wheezes.    Social History   Social History  . Marital Status: Single    Spouse Name: N/A  . Number of Children: N/A  . Years of Education: N/A   Occupational History  . Not on file.   Social History Main Topics  . Smoking status: Current Every Day Smoker -- 1.00 packs/day    Types: Cigarettes  . Smokeless tobacco: Not on file  . Alcohol Use: No  . Drug Use: No  . Sexual Activity: Not on file   Other Topics Concern  . Not on file   Social History Narrative        Assessment & Plan:

## 2016-06-20 NOTE — Assessment & Plan Note (Signed)
Minimal fibrosis.  No follow up needed.  

## 2016-06-20 NOTE — Assessment & Plan Note (Signed)
Now back on his previous medications.

## 2016-06-23 ENCOUNTER — Telehealth: Payer: Self-pay | Admitting: *Deleted

## 2016-06-23 NOTE — Telephone Encounter (Signed)
Relayed results and recommendation to patient. He verbalized understanding, agreement. Andree CossHowell, Joran Kallal M, RN

## 2016-06-23 NOTE — Telephone Encounter (Signed)
-----   Message from Gardiner Barefootobert W Comer, MD sent at 06/20/2016  3:16 PM EDT ----- Please let him know his final HCV viral load is negative and officially considered cured!  Does not need to return except for hep B #3 in August.  thanks

## 2016-07-16 ENCOUNTER — Ambulatory Visit: Payer: Self-pay

## 2016-08-20 ENCOUNTER — Ambulatory Visit (INDEPENDENT_AMBULATORY_CARE_PROVIDER_SITE_OTHER): Payer: Self-pay | Admitting: *Deleted

## 2016-08-20 DIAGNOSIS — Z23 Encounter for immunization: Secondary | ICD-10-CM

## 2016-08-20 DIAGNOSIS — B182 Chronic viral hepatitis C: Secondary | ICD-10-CM

## 2017-01-17 ENCOUNTER — Ambulatory Visit (INDEPENDENT_AMBULATORY_CARE_PROVIDER_SITE_OTHER): Payer: BLUE CROSS/BLUE SHIELD | Admitting: Urgent Care

## 2017-01-17 VITALS — BP 130/90 | HR 89 | Temp 98.3°F | Resp 16 | Ht 65.0 in | Wt 168.8 lb

## 2017-01-17 DIAGNOSIS — Z8619 Personal history of other infectious and parasitic diseases: Secondary | ICD-10-CM

## 2017-01-17 DIAGNOSIS — F319 Bipolar disorder, unspecified: Secondary | ICD-10-CM

## 2017-01-17 DIAGNOSIS — Z87898 Personal history of other specified conditions: Secondary | ICD-10-CM | POA: Diagnosis not present

## 2017-01-17 DIAGNOSIS — F1991 Other psychoactive substance use, unspecified, in remission: Secondary | ICD-10-CM

## 2017-01-17 DIAGNOSIS — F1011 Alcohol abuse, in remission: Secondary | ICD-10-CM

## 2017-01-17 MED ORDER — OXCARBAZEPINE 300 MG PO TABS: ORAL_TABLET | ORAL | 2 refills | Status: DC

## 2017-01-17 MED ORDER — RISPERIDONE 0.5 MG PO TABS: ORAL_TABLET | ORAL | 2 refills | Status: DC

## 2017-01-17 MED ORDER — VENLAFAXINE HCL ER 75 MG PO CP24
150.0000 mg | ORAL_CAPSULE | Freq: Every day | ORAL | 0 refills | Status: DC
Start: 2017-01-17 — End: 2017-06-08

## 2017-01-17 NOTE — Progress Notes (Signed)
  MRN: [id number] DOB: 12/03/1971  Subjective:   Bobby Morrison is a [age] y.o. male presenting for chief complaint of Medication Refill  Patient was previously using [facility] and seeing Dr. West BaliMary Anne Plancey. She had been managing his psychiatric medications and primary care. Patient has a history of Bipolar Disorder. He takes Trileptal for mood stabilizer and uses Effexor for depression. He has been using Risperdal PRN. He feels he does very well with his Trileptal and Effexor. However, he does not want to use Risperdal and would like to come off of this medication. He has been taking his medications consistently for the past 3 years and feels very stable. He used to be a very irritable person, would act on his emotions and was very volatile. He now feels calm, steady. 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 Patient has a history of alcohol and drug use. Has been through rehab several times. Is now sober for the past 3 years. Smokes 1 pack per day for 26 years. Also has a history of hepatitis C. He completed treatment and his last labs from 05/2016 did not detect HCV.  Bobby Morrison has a current medication list which includes the following prescription(s): ibuprofen, oxcarbazepine, risperidone, venlafaxine xr, lamotrigine, and valacyclovir. Also has No Known Allergies.  Bobby Morrison  has a past medical history of Diverticulitis and Mood disorder (HCC). Also  has a past surgical history that includes Colostomy.  Objective:   Vitals: BP 130/90   Pulse 89   Temp 98.3 F (36.8 C) (Oral)   Resp 16   Ht 5\' 5"  (1.651 m)   Wt 168 lb 12.8 oz (76.6 kg)   SpO2 96%   BMI 28.09 kg/m   Physical Exam  Constitutional: He is oriented to person, place, and time. He appears well-developed and well-nourished.  Cardiovascular: Normal rate.   Pulmonary/Chest: Effort normal.  Neurological: He is alert and oriented to person, place, and time.  Psychiatric: His mood appears not anxious. His  affect is not blunt and not labile. His speech is not rapid and/or pressured, not delayed, not tangential and not slurred. He is not agitated, not aggressive, not hyperactive, not slowed and not withdrawn. He does not exhibit a depressed mood. He expresses no homicidal and no suicidal ideation. He is communicative.   Assessment and Plan :   1. Bipolar affective disorder, remission status unspecified (HCC) 2. History of hepatitis C 3. History of drug use 4. History of alcohol abuse - I discussed his psychiatric history at length. Refilled his medications, labs are pending. The patient would like to establish care and do an annual physical exam. I advised him that I would be willing to help him with this but would also need to discuss his case with a physician regarding his psychiatric follow up. He will plan to set up an annual physical exam on 01/24/2017. At that time we will work up his right elbow pain. Patient was in agreement.  [name], PA-C [facility] at [facility] [phone] 01/17/2017  9:24 AM

## 2017-01-17 NOTE — Patient Instructions (Addendum)
Please make sure you set up an appointment for an annual physical exam in 1 week.   Bipolar 1 Disorder Bipolar 1 disorder is a mental health disorder in which a person has episodes of emotional highs (mania), and may also have episodes of emotional lows (depression) in addition to highs. Bipolar 1 disorder is different from other bipolar disorders because it involves extreme manic episodes. These episodes last at least one week or involve symptoms that are so severe that hospitalization is needed to keep the person safe. What increases the risk? The cause of this condition is not known. However, certain factors make you more likely to have bipolar disorder, such as:  Having a family member with the disorder.  An imbalance of certain chemicals in the brain (neurotransmitters).  Stress, such as illness, financial problems, or a death.  Certain conditions that affect the brain or spinal cord (neurologic conditions).  Brain injury (trauma).  Having another mental health disorder, such as:  Obsessive compulsive disorder.  Schizophrenia. What are the signs or symptoms? Symptoms of mania include:  Very high self-esteem or self-confidence.  Decreased need for sleep.  Unusual talkativeness or feeling a need to keep talking. Speech may be very fast. It may seem like you cannot stop talking.  Racing thoughts or constant talking, with quick shifts between topics that may or may not be related (flight of ideas).  Decreased ability to focus or concentrate.  Increased purposeful activity, such as work, studies, or social activity.  Increased nonproductive activity. This could be pacing, squirming and fidgeting, or finger and toe tapping.  Impulsive behavior and poor judgment. This may result in high-risk activities, such as having unprotected sex or spending a lot of money. Symptoms of depression include:  Feeling sad, hopeless, or helpless.  Frequent or uncontrollable crying.  Lack  of feeling or caring about anything.  Sleeping too much.  Moving more slowly than usual.  Not being able to enjoy things you used to enjoy.  Wanting to be alone all the time.  Feeling guilty or worthless.  Lack of energy or motivation.  Trouble concentrating or remembering.  Trouble making decisions.  Increased appetite.  Thoughts of death, or the desire to harm yourself. Sometimes, you may have a mixed mood. This means having symptoms of depression and mania. Stress can make symptoms worse. How is this diagnosed? To diagnose bipolar disorder, your health care provider may ask about your:  Emotional episodes.  Medical history.  Alcohol and drug use. This includes prescription medicines. Certain medical conditions and substances can cause symptoms that seem like bipolar disorder (secondary bipolar disorder). How is this treated? Bipolar disorder is a long-term (chronic) illness. It is best controlled with ongoing (continuous) treatment rather than treatment only when symptoms occur. Treatment may include:  Medicine. Medicine can be prescribed by a provider who specializes in treating mental disorders (psychiatrist).  Medicines called mood stabilizers are usually prescribed.  If symptoms occur even while taking a mood stabilizer, other medicines may be added.  Psychotherapy. Some forms of talk therapy, such as cognitive-behavioral therapy (CBT), can provide support, education, and guidance.  Coping methods, such as journaling or relaxation exercises. These may include:  Yoga.  Meditation.  Deep breathing.  Lifestyle changes, such as:  Limiting alcohol and drug use.  Exercising regularly.  Getting plenty of sleep.  Making healthy eating choices. A combination of medicine, talk therapy, and coping methods is best. A procedure in which electricity is applied to the brain through the  scalp (electroconvulsive therapy) may be used in cases of severe mania when  medicine and psychotherapy work too slowly or do not work. Follow these instructions at home: Activity  Return to your normal activities as told by your health care provider.  Find activities that you enjoy, and make time to do them.  Exercise regularly as told by your health care provider. Lifestyle  Limit alcohol intake to no more than 1 drink a day for nonpregnant women and 2 drinks a day for men. One drink equals 12 oz of beer, 5 oz of wine, or 1 oz of hard liquor.  Follow a set schedule for eating and sleeping.  Eat a balanced diet that includes fresh fruits and vegetables, whole grains, low-fat dairy, and lean meat.  Get 7-8 hours of sleep each night. General instructions  Take over-the-counter and prescription medicines only as told by your health care provider.  Think about joining a support group. Your health care provider may be able to recommend a support group.  Talk with your family and loved ones about your treatment goals and how they can help.  Keep all follow-up visits as told by your health care provider. This is important. Where to find more information: For more information about bipolar disorder, visit the following websites:  [employer] Mental Illness: www.nami.org  [employer] Mental Health: http://www.maynard.net/ Contact a health care provider if:  Your symptoms get worse.  You have side effects from your medicine, and they get worse.  You have trouble sleeping.  You have trouble doing daily activities.  You feel unsafe in your surroundings.  You are dealing with substance abuse. Get help right away if:  You have new symptoms.  You have thoughts about harming yourself.  You self-harm. This information is not intended to replace advice given to you by your health care provider. Make sure you discuss any questions you have with your health care provider. Document Released: 03/16/2001 Document Revised: 08/03/2016 Document  Reviewed: 08/07/2016 Elsevier Interactive Patient Education  2017 [employer]     Oxcarbazepine tablets What is this medicine? OXCARBAZEPINE (ox car BAZ e peen) is used to treat people with epilepsy. It helps prevent partial seizures. This medicine may be used for other purposes; ask your health care provider or pharmacist if you have questions. COMMON BRAND NAME(S): Trileptal What should I tell my health care provider before I take this medicine? They need to know if you have any of these conditions: -Asian ancestry -kidney disease -liver disease -suicidal thoughts, plans, or attempt; a previous suicide attempt by you or a family member -any unusual or allergic reaction to oxcarbazepine, carbamazepine, other medicines, foods, dyes, or preservatives -pregnant or trying to get pregnant -breast-feeding How should I use this medicine? Take this medicine by mouth with a glass of water. Follow the directions on the prescription label. This medicine may be taken with or without food. Take your doses at regular intervals. Do not take your medicine more often than directed. Do not stop taking except on the advice of your doctor or health care professional. A special MedGuide will be given to you by the pharmacist with each prescription and refill. Be sure to read this information carefully each time. Talk to your pediatrician regarding the use of this medicine in children. While this medicine may be prescribed for children as young as 2 years for selected conditions, precautions do apply. Overdosage: If you think you have taken too much of this medicine contact  a poison control center or emergency room at once. NOTE: This medicine is only for you. Do not share this medicine with others. What if I miss a dose? If you miss a dose, take it as soon as you can. If it is almost time for your next dose, take only that dose. Do not take double or extra doses. What may interact with this medicine? Do  not take this medicine with any of the following medications: -carbamazepine This medicine may also interact with the following medications: -birth control pills -certain medicines for seizures like phenobarbital, phenytoin, valproic acid -certain medicines for high blood pressure like felodipine, diltiazem, verapamil -cyclosporine This list may not describe all possible interactions. Give your health care provider a list of all the medicines, herbs, non-prescription drugs, or dietary supplements you use. Also tell them if you smoke, drink alcohol, or use illegal drugs. Some items may interact with your medicine. What should I watch for while using this medicine? Visit your doctor or health care professional for regular checks on your progress. Do not stop taking this medicine suddenly. This increases the risk of seizures. Wear a Arboriculturist or necklace. Carry an identification card with information about your condition, medications, and doctor or health care professional. Rarely, serious skin allergic reactions may occur with this medicine. If you develop a skin rash, redness, itching, peeling skin inside your mouth, swollen glands, or a fever while taking this medicine, contact your health care provider immediately. You may get drowsy or dizzy. Do not drive, use machinery, or do anything that needs mental alertness until you know how this drug affects you. Do not stand or sit up quickly, especially if you are an older patient. This reduces the risk of dizzy or fainting spells. Alcohol can make you more drowsy and dizzy. Avoid alcoholic drinks. Birth control pills may not work properly while you are taking this medicine. Talk to your doctor about using an extra method of birth control. The use of this medicine may increase the chance of suicidal thoughts or actions. Pay special attention to how you are responding while on this medicine. Any worsening of mood, or thoughts of suicide or dying  should be reported to your health care professional right away. Women who become pregnant while using this medicine may enroll in the [place] [employer] Antiepileptic Drug Pregnancy Registry by calling [phone]. This registry collects information about the safety of antiepileptic drug use during pregnancy. What side effects may I notice from receiving this medicine? Side effects that you should report to your doctor or health care professional as soon as possible: -allergic reactions such as skin rash or itching, hives, swelling of the lips, mouth, tongue, or throat -changes in vision -confusion -difficulty passing urine or change in the amount of urine -fever -infection -nausea, vomiting -problems with balance, speaking, walking -redness, blistering, peeling or loosening of the skin, including inside the mouth -swelling of feet, hands -unusual bleeding, bruising -unusually weak or tired -worsening of mood, thoughts or actions of suicide or dying -yellowing of eyes, skin Side effects that usually do not require medical attention (report to your doctor or health care professional if they continue or are bothersome): -constipation or diarrhea -headache -loss of appetite -nervous -stomach upset -tremors -trouble sleeping This list may not describe all possible side effects. Call your doctor for medical advice about side effects. You may report side effects to FDA at 1-800-FDA-1088. Where should I keep my medicine? Keep out of reach of children. Store  at room temperature between 15 and 30 degrees C (59 and 86 degrees F). Keep container tightly closed. Throw away any unused medicine after the expiration date. NOTE: This sheet is a summary. It may not cover all possible information. If you have questions about this medicine, talk to your doctor, pharmacist, or health care provider.  2017 Elsevier/Gold Standard (2013-06-08 15:09:57)    Risperidone tablets What is this  medicine? RISPERIDONE (ris PER i done) is an antipsychotic. It is used to treat schizophrenia, bipolar disorder, and some symptoms of autism. This medicine may be used for other purposes; ask your health care provider or pharmacist if you have questions. COMMON BRAND NAME(S): Risperdal What should I tell my health care provider before I take this medicine? They need to know if you have any of these conditions: -blood disorder or disease -dementia -diabetes or a family history of diabetes -difficulty swallowing -heart disease or previous heart attack -history of brain tumor or head injury -history of breast cancer -irregular heartbeat or low blood pressure -kidney or liver disease -Parkinson's disease -seizures (convulsions) -an unusual or allergic reaction to risperidone, paliperidone, other medicines, foods, dyes, or preservatives -pregnant or trying to get pregnant -breast-feeding How should I use this medicine? Take this medicine by mouth with a glass of water. Follow the directions on the prescription label. You can take it with or without food. If it upsets your stomach, take it with food. Take your medicine at regular intervals. Do not take it more often than directed. Do not stop taking except on your doctor's advice. Talk to your pediatrician regarding the use of this medicine in children. While this drug may be prescribed for children as young as 33 years of age for selected conditions, precautions do apply. Overdosage: If you think you have taken too much of this medicine contact a poison control center or emergency room at once. NOTE: This medicine is only for you. Do not share this medicine with others. What if I miss a dose? If you miss a dose, take it as soon as you can. If it is almost time for your next dose, take only that dose. Do not take double or extra doses. What may interact with this medicine? Do not take this medicine with any of the following  medications: -certain medicines for fungal infections like fluconazole, itraconazole, ketoconazole, posaconazole, voriconazole -cisapride -dofetilide -dronedarone -droperidol -pimozide -sparfloxacin -thioridazine This medicine may also interact with the following medications: -arsenic trioxide -certain antibiotics like clarithromycin, gatifloxacin, levofloxacin, moxifloxacin, pentamidine, rifampin -certain medicines for blood pressure -certain medicines for cancer -certain medicines for irregular heart beat -certain medications for Parkinson's disease like levodopa -certain medicines for seizures like carbamazepine -certain medicines for sleep or sedation -narcotic medicines for pain -other medicines for mental anxiety, depression, or psychotic disturbances -other medicines that prolong the QT interval (cause an abnormal heart rhythm) -ritonavir This list may not describe all possible interactions. Give your health care provider a list of all the medicines, herbs, non-prescription drugs, or dietary supplements you use. Also tell them if you smoke, drink alcohol, or use illegal drugs. Some items may interact with your medicine. What should I watch for while using this medicine? Visit your doctor or health care professional for regular checks on your progress. It may be several weeks before you see the full effects. Do not suddenly stop taking this medicine. You may need to gradually reduce the dose. Only stop taking this medicine on the advice of your doctor or health care  professional. Bonita Quin may get dizzy or drowsy. Do not drive, use machinery, or do anything that needs mental alertness until you know how this medicine affects you. Do not stand or sit up quickly, especially if you are an older patient. This reduces the risk of dizzy or fainting spells. Alcohol can increase dizziness and drowsiness. Avoid alcoholic drinks. You can get a hangover effect the morning after a bedtime dose. Do not  treat yourself for colds, diarrhea or allergies. Ask your doctor or health care professional for advice, some nonprescription medicines may increase possible side effects. This medicine can reduce the response of your body to heat or cold. Dress warm in cold weather and stay hydrated in hot weather. If possible, avoid extreme temperatures like saunas, hot tubs, very hot or cold showers, or activities that can cause dehydration such as vigorous exercise. What side effects may I notice from receiving this medicine? Side effects that you should report to your doctor or health care professional as soon as possible: -aching muscles and joints -confusion -fainting spells -fast or irregular heartbeat -fever or chills, sore throat -increased hunger or thirst -increased urination -loss of balance, difficulty walking or falls -stiffness, spasms, trembling -uncontrollable head, mouth, neck, arm, or leg movements -unusually weak or tired Side effects that usually do not require medical attention (report to your doctor or health care professional if they continue or are bothersome): -constipation -decreased sexual ability -difficulty sleeping -drowsiness or dizziness -increase or decrease in saliva -nausea, vomiting -weight gain This list may not describe all possible side effects. Call your doctor for medical advice about side effects. You may report side effects to FDA at 1-800-FDA-1088. Where should I keep my medicine? Keep out of the reach of children. Store at room temperature between 15 and 25 degrees C (59 and 77 degrees F). Protect from light. Throw away any unused medicine after the expiration date. NOTE: This sheet is a summary. It may not cover all possible information. If you have questions about this medicine, talk to your doctor, pharmacist, or health care provider.  2017 Elsevier/Gold Standard (2016-07-28 18:50:25)     Venlafaxine tablets What is this medicine? VENLAFAXINE (VEN  la fax een) is used to treat depression, anxiety and panic disorder. This medicine may be used for other purposes; ask your health care provider or pharmacist if you have questions. COMMON BRAND NAME(S): Effexor What should I tell my health care provider before I take this medicine? They need to know if you have any of these conditions: -bleeding disorders -glaucoma -heart disease -high blood pressure -high cholesterol -kidney disease -liver disease -low levels of sodium in the blood -mania or bipolar disorder -seizures -suicidal thoughts, plans, or attempt; a previous suicide attempt by you or a family -take medicines that treat or prevent blood clots -thyroid disease -an unusual or allergic reaction to venlafaxine, desvenlafaxine, other medicines, foods, dyes, or preservatives -pregnant or trying to get pregnant -breast-feeding How should I use this medicine? Take this medicine by mouth with a glass of water. Follow the directions on the prescription label. Take it with food. Take your medicine at regular intervals. Do not take your medicine more often than directed. Do not stop taking this medicine suddenly except upon the advice of your doctor. Stopping this medicine too quickly may cause serious side effects or your condition may worsen. A special MedGuide will be given to you by the pharmacist with each prescription and refill. Be sure to read this information carefully each time. Talk  to your pediatrician regarding the use of this medicine in children. Special care may be needed. Overdosage: If you think you have taken too much of this medicine contact a poison control center or emergency room at once. NOTE: This medicine is only for you. Do not share this medicine with others. What if I miss a dose? If you miss a dose, take it as soon as you can. If it is almost time for your next dose, take only that dose. Do not take double or extra doses. What may interact with this  medicine? Do not take this medicine with any of the following medications: -certain medicines for fungal infections like fluconazole, itraconazole, ketoconazole, posaconazole, voriconazole -cisapride -desvenlafaxine -dofetilide -dronedarone -duloxetine -levomilnacipran -linezolid -MAOIs like Carbex, Eldepryl, Marplan, Nardil, and Parnate -methylene blue (injected into a vein) -milnacipran -pimozide -thioridazine -ziprasidone This medicine may also interact with the following medications: -amphetamines -aspirin and aspirin-like medicines -certain medicines for depression, anxiety, or psychotic disturbances -certain medicines for migraine headaches like almotriptan, eletriptan, frovatriptan, naratriptan, rizatriptan, sumatriptan, zolmitriptan -certain medicines for sleep -certain medicines that treat or prevent blood clots like dalteparin, enoxaparin, warfarin -cimetidine -clozapine -diuretics -fentanyl -furazolidone -indinavir -isoniazid -lithium -metoprolol -NSAIDS, medicines for pain and inflammation, like ibuprofen or naproxen -other medicines that prolong the QT interval (cause an abnormal heart rhythm) -procarbazine -rasagiline -supplements like St. John's wort, kava kava, valerian -tramadol -tryptophan This list may not describe all possible interactions. Give your health care provider a list of all the medicines, herbs, non-prescription drugs, or dietary supplements you use. Also tell them if you smoke, drink alcohol, or use illegal drugs. Some items may interact with your medicine. What should I watch for while using this medicine? Tell your doctor if your symptoms do not get better or if they get worse. Visit your doctor or health care professional for regular checks on your progress. Because it may take several weeks to see the full effects of this medicine, it is important to continue your treatment as prescribed by your doctor. Patients and their families should  watch out for new or worsening thoughts of suicide or depression. Also watch out for sudden changes in feelings such as feeling anxious, agitated, panicky, irritable, hostile, aggressive, impulsive, severely restless, overly excited and hyperactive, or not being able to sleep. If this happens, especially at the beginning of treatment or after a change in dose, call your health care professional. This medicine can cause an increase in blood pressure. Check with your doctor for instructions on monitoring your blood pressure while taking this medicine. You may get drowsy or dizzy. Do not drive, use machinery, or do anything that needs mental alertness until you know how this medicine affects you. Do not stand or sit up quickly, especially if you are an older patient. This reduces the risk of dizzy or fainting spells. Alcohol may interfere with the effect of this medicine. Avoid alcoholic drinks. Your mouth may get dry. Chewing sugarless gum, sucking hard candy and drinking plenty of water will help. Contact your doctor if the problem does not go away or is severe. What side effects may I notice from receiving this medicine? Side effects that you should report to your doctor or health care professional as soon as possible: -allergic reactions like skin rash, itching or hives, swelling of the face, lips, or tongue -anxious -breathing problems -confusion -changes in vision -chest pain -confusion -elevated mood, decreased need for sleep, racing thoughts, impulsive behavior -eye pain -fast, irregular heartbeat -feeling faint or  lightheaded, falls -feeling agitated, angry, or irritable -hallucination, loss of contact with reality -high blood pressure -loss of balance or coordination -palpitations -redness, blistering, peeling or loosening of the skin, including inside the mouth -restlessness, pacing, inability to keep still -seizures -stiff muscles -suicidal thoughts or other mood changes -trouble  passing urine or change in the amount of urine -trouble sleeping -unusual bleeding or bruising -unusually weak or tired -vomiting Side effects that usually do not require medical attention (report to your doctor or health care professional if they continue or are bothersome): -change in sex drive or performance -change in appetite or weight -constipation -dizziness -dry mouth -headache -increased sweating -nausea -tired This list may not describe all possible side effects. Call your doctor for medical advice about side effects. You may report side effects to FDA at 1-800-FDA-1088. Where should I keep my medicine? Keep out of the reach of children. Store at a controlled temperature between 20 and 25 degrees C (68 and 77 degrees F), in a dry place. Throw away any unused medicine after the expiration date. NOTE: This sheet is a summary. It may not cover all possible information. If you have questions about this medicine, talk to your doctor, pharmacist, or health care provider.  2017 Elsevier/Gold Standard (2016-05-08 18:42:26)

## 2017-01-18 LAB — COMPREHENSIVE METABOLIC PANEL
A/G RATIO: 1.9 (ref 1.2–2.2)
ALK PHOS: 107 IU/L (ref 39–117)
ALT: 9 IU/L (ref 0–44)
AST: 12 IU/L (ref 0–40)
Albumin: 4.7 g/dL (ref 3.5–5.5)
BUN/Creatinine Ratio: 12 (ref 9–20)
BUN: 10 mg/dL (ref 6–24)
Bilirubin Total: 0.3 mg/dL (ref 0.0–1.2)
CALCIUM: 9.7 mg/dL (ref 8.7–10.2)
CO2: 23 mmol/L (ref 18–29)
Chloride: 91 mmol/L — ABNORMAL LOW (ref 96–106)
Creatinine, Ser: 0.82 mg/dL (ref 0.76–1.27)
GFR calc Af Amer: 123 mL/min/{1.73_m2} (ref >59)
GFR, EST NON AFRICAN AMERICAN: 107 mL/min/{1.73_m2} (ref >59)
GLOBULIN, TOTAL: 2.5 g/dL (ref 1.5–4.5)
Glucose: 75 mg/dL (ref 65–99)
POTASSIUM: 4.6 mmol/L (ref 3.5–5.2)
SODIUM: 129 mmol/L — AB (ref 134–144)
Total Protein: 7.2 g/dL (ref 6.0–8.5)

## 2017-01-24 ENCOUNTER — Ambulatory Visit (INDEPENDENT_AMBULATORY_CARE_PROVIDER_SITE_OTHER): Payer: BLUE CROSS/BLUE SHIELD | Admitting: Urgent Care

## 2017-01-24 VITALS — BP 120/68 | HR 68 | Resp 16 | Ht 65.0 in | Wt 168.0 lb

## 2017-01-24 DIAGNOSIS — L821 Other seborrheic keratosis: Secondary | ICD-10-CM

## 2017-01-24 DIAGNOSIS — N529 Male erectile dysfunction, unspecified: Secondary | ICD-10-CM | POA: Diagnosis not present

## 2017-01-24 DIAGNOSIS — Z0000 Encounter for general adult medical examination without abnormal findings: Secondary | ICD-10-CM

## 2017-01-24 DIAGNOSIS — F319 Bipolar disorder, unspecified: Secondary | ICD-10-CM

## 2017-01-24 DIAGNOSIS — M25521 Pain in right elbow: Secondary | ICD-10-CM | POA: Diagnosis not present

## 2017-01-24 DIAGNOSIS — Z114 Encounter for screening for human immunodeficiency virus [HIV]: Secondary | ICD-10-CM

## 2017-01-24 DIAGNOSIS — F17200 Nicotine dependence, unspecified, uncomplicated: Secondary | ICD-10-CM | POA: Diagnosis not present

## 2017-01-24 DIAGNOSIS — Z8619 Personal history of other infectious and parasitic diseases: Secondary | ICD-10-CM

## 2017-01-24 DIAGNOSIS — L989 Disorder of the skin and subcutaneous tissue, unspecified: Secondary | ICD-10-CM | POA: Diagnosis not present

## 2017-01-24 MED ORDER — LAMOTRIGINE 25 MG PO TABS: 25.0000 mg | ORAL_TABLET | Freq: Every day | ORAL | 0 refills | Status: DC

## 2017-01-24 MED ORDER — NAPROXEN SODIUM 550 MG PO TABS: 550.0000 mg | ORAL_TABLET | Freq: Two times a day (BID) | ORAL | 1 refills | Status: DC

## 2017-01-24 NOTE — Patient Instructions (Addendum)
For Tylenol, you may take 500mg  every 8 hours for pain and inflammation associated with your elbow. Use Anaprox up to twice daily for breakthrough pain as needed.    Keeping you healthy  Get these tests  Blood pressure- Have your blood pressure checked once a year by your healthcare provider.  Normal blood pressure is 120/80.  Weight- Have your body mass index (BMI) calculated to screen for obesity.  BMI is a measure of body fat based on height and weight. You can also calculate your own BMI at https://www.west-esparza.com/www.nhlbisupport.com/bmi/.  Cholesterol- Have your cholesterol checked regularly starting at age 46, sooner may be necessary if you have diabetes, high blood pressure, if a family member developed heart diseases at an early age or if you smoke.   Chlamydia, HIV, and other sexual transmitted disease- Get screened each year until the age of 46 then within three months of each new sexual partner.  Diabetes- Have your blood sugar checked regularly if you have high blood pressure, high cholesterol, a family history of diabetes or if you are overweight.  Get these vaccines  Flu shot- Every fall.  Tetanus shot- Every 10 years.  Menactra- Single dose; prevents meningitis.  Take these steps  Don't smoke- If you do smoke, ask your healthcare provider about quitting. For tips on how to quit, go to www.smokefree.gov or call 1-800-QUIT-NOW.  Be physically active- Exercise 5 days a week for at least 30 minutes.  If you are not already physically active start slow and gradually work up to 30 minutes of moderate physical activity.  Examples of moderate activity include walking briskly, mowing the yard, dancing, swimming bicycling, etc.  Eat a healthy diet- Eat a variety of healthy foods such as fruits, vegetables, low fat milk, low fat cheese, yogurt, lean meats, poultry, fish, beans, tofu, etc.  For more information on healthy eating, go to www.thenutritionsource.org  Drink alcohol in moderation- Limit  alcohol intake two drinks or less a day.  Never drink and drive.  Dentist- Brush and floss teeth twice daily; visit your dentis twice a year.  Depression-Your emotional health is as important as your physical health.  If you're feeling down, losing interest in things you normally enjoy please talk with your healthcare provider.  Gun Safety- If you keep a gun in your home, keep it unloaded and with the safety lock on.  Bullets should be stored separately.  Helmet use- Always wear a helmet when riding a motorcycle, bicycle, rollerblading or skateboarding.  Safe sex- If you may be exposed to a sexually transmitted infection, use a condom  Seat belts- Seat bels can save your life; always wear one.  Smoke/Carbon Monoxide detectors- These detectors need to be installed on the appropriate level of your home.  Replace batteries at least once a year.  Skin Cancer- When out in the sun, cover up and use sunscreen SPF 15 or higher.  Violence- If anyone is threatening or hurting you, please tell your healthcare provider.   Sildenafil tablets (Viagra) What is this medicine? SILDENAFIL (sil DEN a fil) is used to treat erection problems in men. This medicine may be used for other purposes; ask your health care provider or pharmacist if you have questions. COMMON BRAND NAME(S): Viagra What should I tell my health care provider before I take this medicine? They need to know if you have any of these conditions: -bleeding disorders -eye or vision problems, including a rare inherited eye disease called retinitis pigmentosa -anatomical deformation of the penis,  Peyronie's disease, or history of priapism (painful and prolonged erection) -heart disease, angina, a history of heart attack, irregular heart beats, or other heart problems -high or low blood pressure -history of blood diseases, like sickle cell anemia or leukemia -history of stomach bleeding -kidney disease -liver disease -stroke -an unusual  or allergic reaction to sildenafil, other medicines, foods, dyes, or preservatives -pregnant or trying to get pregnant -breast-feeding How should I use this medicine? Take this medicine by mouth with a glass of water. Follow the directions on the prescription label. The dose is usually taken 1 hour before sexual activity. You should not take the dose more than once per day. Do not take your medicine more often than directed. Talk to your pediatrician regarding the use of this medicine in children. This medicine is not used in children for this condition. Overdosage: If you think you have taken too much of this medicine contact a poison control center or emergency room at once. NOTE: This medicine is only for you. Do not share this medicine with others. What if I miss a dose? This does not apply. Do not take double or extra doses. What may interact with this medicine? Do not take this medicine with any of the following medications: -cisapride -nitrates like amyl nitrite, isosorbide dinitrate, isosorbide mononitrate, nitroglycerin -riociguat This medicine may also interact with the following medications: -antiviral medicines for HIV or AIDS -bosentan -certain medicines for benign prostatic hyperplasia (BPH) -certain medicines for blood pressure -certain medicines for fungal infections like ketoconazole and itraconazole -cimetidine -erythromycin -rifampin This list may not describe all possible interactions. Give your health care provider a list of all the medicines, herbs, non-prescription drugs, or dietary supplements you use. Also tell them if you smoke, drink alcohol, or use illegal drugs. Some items may interact with your medicine. What should I watch for while using this medicine? If you notice any changes in your vision while taking this drug, call your doctor or health care professional as soon as possible. Stop using this medicine and call your health care provider right away if you  have a loss of sight in one or both eyes. Contact your doctor or health care professional right away if you have an erection that lasts longer than 4 hours or if it becomes painful. This may be a sign of a serious problem and must be treated right away to prevent permanent damage. If you experience symptoms of nausea, dizziness, chest pain or arm pain upon initiation of sexual activity after taking this medicine, you should refrain from further activity and call your doctor or health care professional as soon as possible. Do not drink alcohol to excess (examples, 5 glasses of wine or 5 shots of whiskey) when taking this medicine. When taken in excess, alcohol can increase your chances of getting a headache or getting dizzy, increasing your heart rate or lowering your blood pressure. Using this medicine does not protect you or your partner against HIV infection (the virus that causes AIDS) or other sexually transmitted diseases. What side effects may I notice from receiving this medicine? Side effects that you should report to your doctor or health care professional as soon as possible: -allergic reactions like skin rash, itching or hives, swelling of the face, lips, or tongue -breathing problems -changes in hearing -changes in vision -chest pain -fast, irregular heartbeat -prolonged or painful erection -seizures Side effects that usually do not require medical attention (report to your doctor or health care professional if they continue  or are bothersome): -back pain -dizziness -flushing -headache -indigestion -muscle aches -nausea -stuffy or runny nose This list may not describe all possible side effects. Call your doctor for medical advice about side effects. You may report side effects to FDA at 1-800-FDA-1088. Where should I keep my medicine? Keep out of reach of children. Store at room temperature between 15 and 30 degrees C (59 and 86 degrees F). Throw away any unused medicine after  the expiration date. NOTE: This sheet is a summary. It may not cover all possible information. If you have questions about this medicine, talk to your doctor, pharmacist, or health care provider.  2017 Elsevier/Gold Standard (2015-11-21 12:00:25)     IF you received an x-ray today, you will receive an invoice from [facility]. Please contact [facility] at [phone] with questions or concerns regarding your invoice.   IF you received labwork today, you will receive an invoice from [facility]. Please contact [facility] at [phone] with questions or concerns regarding your invoice.   Our billing staff will not be able to assist you with questions regarding bills from these companies.  You will be contacted with the lab results as soon as they are available. The fastest way to get your results is to activate your My Chart account. Instructions are located on the last page of this paperwork. If you have not heard from [place] regarding the results in 2 weeks, please contact this office.

## 2017-01-24 NOTE — Progress Notes (Signed)
MRN: [id number]  Subjective:   Mr. Shakeel Disney is a 46 y.o. male presenting for annual physical exam and elbow pain. Patient has a girlfriend, works as a Nutritional therapist. He is in a monogamous relationship and declines STI testing. Smokes 1ppd for ~25 years, is interested in quitting. Denies drinking alcohol or drug use, is 3 years sober.   Medical care team includes: PCP: No primary care provider on file.  Elbow pain - Reports ~3 week history of intermittent achy right elbow pain. Pain started after as strenuous work day. Since then he has had persistent and intermittent elbow pain. He has been using ibuprofen with minimal to some relief. Denies trauma, bruising, swelling, redness, warmth.   ED - Reports having difficult time with obtaining erections, having sex with his girlfriend. He would like to consider medical therapy for this.  Samuell has a current medication list which includes the following prescription(s): ibuprofen, lamotrigine, oxcarbazepine, risperidone, venlafaxine xr, and valacyclovir. He is allergic to bee venom.  Orrie  has a past medical history of Diverticulitis and Mood disorder (HCC). Also  has a past surgical history that includes Colostomy. Patient's mother has a history of uterine cancer, she is in remission.  Immunizations: Td 04/21/2016, flu 08/20/2016  ROS  Objective:   Vitals: BP 120/68   Pulse 68   Resp 16   Ht 5\' 5"  (1.651 m)   Wt 168 lb (76.2 kg)   BMI 27.96 kg/m   Physical Exam  Constitutional: He is oriented to person, place, and time. He appears well-developed and well-nourished.  HENT:  TM's intact bilaterally, no effusions or erythema. Nasal turbinates pink and moist, nasal passages patent. No sinus tenderness. Oropharynx clear, mucous membranes moist, dentition in good repair.  Eyes: Conjunctivae and EOM are normal. Pupils are equal, round, and reactive to light. Right eye exhibits no discharge. Left eye exhibits no discharge. No scleral  icterus.  Neck: Normal range of motion. Neck supple. No thyromegaly present.  Cardiovascular: Normal rate, regular rhythm and intact distal pulses.  Exam reveals no gallop and no friction rub.   No murmur heard. Pulmonary/Chest: No stridor. No respiratory distress. He has no wheezes. He has no rales.  Abdominal: Soft. Bowel sounds are normal. He exhibits no distension and no mass. There is no tenderness.  Musculoskeletal: Normal range of motion. He exhibits no edema or tenderness.  Lymphadenopathy:    He has no cervical adenopathy.  Neurological: He is alert and oriented to person, place, and time. He has normal reflexes.  Skin: Skin is warm and dry. No rash noted. No erythema. No pallor.     Psychiatric: He has a normal mood and affect.   Assessment and Plan :   1. Annual physical exam - Medically stable, labs pending. Discussed healthy lifestyle, diet, exercise, preventative care, vaccinations, and addressed patient's concerns. Patient is to schedule an appointment for procedure only visit (no OV charge) for biopsy of skin lesion.  2. Screening for HIV (human immunodeficiency virus) - Labs pending.  3. Bipolar affective disorder, remission status unspecified (HCC) - Patient will check to see cost of Lamictal versus oxcarbazepine. He will be in touch with me about this and we will decide what regimen he may use. Of note, while patient was undergoing treatment for Hep C, he used Lamictal as monotherapy successfully.   4. Erectile dysfunction, unspecified erectile dysfunction type - I advised against using Viagra or Cialis due to major risk factors of smoking, possibility of heart disease and multiple interactions  with his psychiatric medications. See #7 below. Patient is in agreement.  5. Right elbow pain - Schedule APAP and use Anaprox for breakthrough pain. Ice elbow after work days. Recheck in 1-2 weeks and if no improvement consider short steroid course and/or work  restrictions.  6. History of hepatitis C - Hepatitis C antibody pending.  7. Tobacco use disorder - Continue efforts at smoking cessation. Patient is not a good candidate for medical therapy. Had a relapse of his bipolar disorder with Wellbutrin.  8. Seborrheic keratosis 9. Skin lesion - Will perform biopsy of skin lesion when I have another Saturday appointment.  Wallis BambergMario Edwar Coe, PA-C [facility] at [facility] [phone] 01/24/2017  10:49 AM

## 2017-01-24 NOTE — Addendum Note (Signed)
Addended by: Wallis BambergMANI, Hobert Poplaski on: 01/24/2017 11:26 AM   Modules accepted: Orders

## 2017-01-25 LAB — LIPID PANEL
CHOL/HDL RATIO: 5.5 ratio — AB (ref 0.0–5.0)
Cholesterol, Total: 188 mg/dL (ref 100–199)
HDL: 34 mg/dL — AB (ref >39)
LDL Calculated: 140 mg/dL — ABNORMAL HIGH (ref 0–99)
TRIGLYCERIDES: 68 mg/dL (ref 0–149)
VLDL Cholesterol Cal: 14 mg/dL (ref 5–40)

## 2017-01-25 LAB — CBC
HEMATOCRIT: 44.9 % (ref 37.5–51.0)
Hemoglobin: 15.5 g/dL (ref 13.0–17.7)
MCH: 31.8 pg (ref 26.6–33.0)
MCHC: 34.5 g/dL (ref 31.5–35.7)
MCV: 92 fL (ref 79–97)
Platelets: 274 10*3/uL (ref 150–379)
RBC: 4.88 x10E6/uL (ref 4.14–5.80)
RDW: 13.9 % (ref 12.3–15.4)
WBC: 6 10*3/uL (ref 3.4–10.8)

## 2017-01-25 LAB — HEPATITIS C ANTIBODY: Hep C Virus Ab: >11 s/co ratio — ABNORMAL HIGH (ref 0.0–0.9)

## 2017-01-25 LAB — TSH: TSH: 1.07 u[IU]/mL (ref 0.450–4.500)

## 2017-01-25 LAB — HIV ANTIBODY (ROUTINE TESTING W REFLEX): HIV Screen 4th Generation wRfx: NONREACTIVE

## 2017-01-28 ENCOUNTER — Telehealth: Payer: Self-pay

## 2017-01-28 ENCOUNTER — Other Ambulatory Visit: Payer: Self-pay | Admitting: Urgent Care

## 2017-01-28 NOTE — Telephone Encounter (Signed)
Pt calling and returning shay call    Please advise [phone]

## 2017-01-28 NOTE — Addendum Note (Signed)
Addended by: Baldwin CrownJOHNSON, Imran Nuon D on: 01/28/2017 09:54 AM   Modules accepted: Orders

## 2017-01-29 ENCOUNTER — Other Ambulatory Visit: Payer: BLUE CROSS/BLUE SHIELD

## 2017-01-30 NOTE — Telephone Encounter (Signed)
Please advise 

## 2017-02-02 ENCOUNTER — Other Ambulatory Visit: Payer: Self-pay | Admitting: Urgent Care

## 2017-02-02 DIAGNOSIS — E782 Mixed hyperlipidemia: Secondary | ICD-10-CM

## 2017-02-02 LAB — HCV REALTIME ABBOTT

## 2017-02-02 MED ORDER — ATORVASTATIN CALCIUM 20 MG PO TABS: 20.0000 mg | ORAL_TABLET | Freq: Every day | ORAL | 3 refills | Status: AC

## 2017-03-10 ENCOUNTER — Telehealth: Payer: Self-pay | Admitting: Urgent Care

## 2017-03-10 NOTE — Telephone Encounter (Signed)
Pt needs to talk with Bobby Morrison about his pschological meds and about his arm still hurting   Best [phone]

## 2017-03-11 MED ORDER — LAMOTRIGINE 50 MG PO TBDP: 1.0000 | ORAL_TABLET | Freq: Two times a day (BID) | ORAL | 1 refills | Status: DC

## 2017-03-11 NOTE — Telephone Encounter (Signed)
Ov needed 

## 2017-03-11 NOTE — Telephone Encounter (Signed)
Last seen 01/24/17.. Ov needed

## 2017-03-11 NOTE — Telephone Encounter (Signed)
Patient called back, is taking 75mg  Effexor twice daily and Lamictal 25mg  twice daily. We agreed to increase his Lamictal to 50mg  twice daily. He will check in with me next weekend.

## 2017-03-11 NOTE — Telephone Encounter (Signed)
Patient is off risperidone. He would like to titrate his Lamictal up. He is not sure what dose he is taking now but will check later today. Plan will be to take him up to 100mg  QD since he has been on it now for 4-6 weeks. I will maintain his Effexor. Patient will call back and let me know the dose he is on.

## 2017-03-16 ENCOUNTER — Telehealth: Payer: Self-pay | Admitting: Urgent Care

## 2017-03-16 NOTE — Telephone Encounter (Signed)
Pt is needing to talk with Wallis Bambergmario mani about the meds that was just prescribed that disolve in your mouth and if it is what he needed to call in it is very expenisve and would like something cheaper  Best number [phone]

## 2017-03-16 NOTE — Telephone Encounter (Signed)
lamictal

## 2017-03-18 ENCOUNTER — Telehealth: Payer: Self-pay | Admitting: Urgent Care

## 2017-03-18 NOTE — Telephone Encounter (Signed)
Pt needs correction on meds, says he has never been taken the ones that dissolve in mouth.   Please advise

## 2017-03-19 MED ORDER — LAMOTRIGINE 100 MG PO TABS
100.0000 mg | ORAL_TABLET | Freq: Every day | ORAL | 1 refills | Status: DC
Start: 2017-03-19 — End: 2017-07-17

## 2017-03-19 NOTE — Telephone Encounter (Signed)
Patient will take the 100mg  tablet. He plans on checking with his pharmacist if he can break the pill in half. He typically likes to take the dose twice daily. I advised that I am okay with this as long as the pharmacist says it is okay to break the pill in half. They were not open this morning when I called so I could not ask them myself. Patient will rtc in 4 weeks for recheck. He has a skin lesion he is considering coming in for this weekend. Otherwise, will f/u in 4 weeks.

## 2017-03-21 ENCOUNTER — Ambulatory Visit: Payer: BLUE CROSS/BLUE SHIELD

## 2017-05-12 ENCOUNTER — Encounter: Payer: Self-pay | Admitting: Physician Assistant

## 2017-05-12 ENCOUNTER — Ambulatory Visit (INDEPENDENT_AMBULATORY_CARE_PROVIDER_SITE_OTHER): Payer: BLUE CROSS/BLUE SHIELD | Admitting: Physician Assistant

## 2017-05-12 VITALS — BP 115/74 | HR 74 | Temp 98.0°F | Resp 18 | Ht 65.0 in | Wt 162.2 lb

## 2017-05-12 DIAGNOSIS — W57XXXA Bitten or stung by nonvenomous insect and other nonvenomous arthropods, initial encounter: Secondary | ICD-10-CM | POA: Diagnosis not present

## 2017-05-12 DIAGNOSIS — L989 Disorder of the skin and subcutaneous tissue, unspecified: Secondary | ICD-10-CM | POA: Diagnosis not present

## 2017-05-12 MED ORDER — DOXYCYCLINE HYCLATE 100 MG PO TABS: 200.0000 mg | ORAL_TABLET | Freq: Two times a day (BID) | ORAL | 0 refills | Status: DC

## 2017-05-12 NOTE — Progress Notes (Signed)
   Bobby Morrison  MRN: [id number] DOB: 12/25/1970  PCP: Wallis BambergMani, Mario, PA-C  Chief Complaint  Patient presents with  . Tick bite    inner left leg; found the tick bite on Sunday morning, redness, itchy    Subjective:  Pt presents to clinic for tick bite to his left inner thigh - small tick removed with ETOH - full of blood and was unable to identify to type of tick.  The tick came off without problems.  The area is itchy - he did use liquid bandaid this am and that has helped the itching.  He feels fine.    Review of Systems  Constitutional: Negative for chills and fever.  Musculoskeletal: Negative for myalgias.  Neurological: Negative for headaches.    Patient Active Problem List   Diagnosis Date Noted  . Substance abuse 02/19/2016  . Liver fibrosis (HCC) 02/19/2016  . Depression 02/19/2016  . Chronic hepatitis C without hepatic coma (HCC) 04/02/2015    Current Outpatient Prescriptions on File Prior to Visit  Medication Sig Dispense Refill  . atorvastatin (LIPITOR) 20 MG tablet Take 1 tablet (20 mg total) by mouth daily. 90 tablet 3  . ibuprofen (ADVIL,MOTRIN) 800 MG tablet Take 1 tablet (800 mg total) by mouth every 8 (eight) hours as needed. 30 tablet 0  . lamoTRIgine (LAMICTAL) 100 MG tablet Take 1 tablet (100 mg total) by mouth daily. 90 tablet 1  . venlafaxine XR (EFFEXOR-XR) 75 MG 24 hr capsule Take 2 capsules (150 mg total) by mouth daily with breakfast. 180 capsule 0  . valACYclovir (VALTREX) 1000 MG tablet Take 1 tablet (1,000 mg total) by mouth 2 (two) times daily. (Patient not taking: Reported on 01/17/2017) 10 tablet 1   No current facility-administered medications on file prior to visit.     Allergies  Allergen Reactions  . Bee Venom     Pt patients past, family and social history were reviewed and updated.   Objective:  BP 115/74 (BP Location: Right Arm, Patient Position: Sitting, Cuff Size: Normal)   Pulse 74   Temp 98 F (36.7 C) (Oral)   Resp 18    Ht 5\' 5"  (1.651 m)   Wt 162 lb 3.2 oz (73.6 kg)   SpO2 96%   BMI 26.99 kg/m   Physical Exam  Constitutional: He is oriented to person, place, and time and well-developed, well-nourished, and in no distress.  HENT:  Head: Normocephalic and atraumatic.  Right Ear: External ear normal.  Left Ear: External ear normal.  Eyes: Conjunctivae are normal.  Neck: Normal range of motion.  Pulmonary/Chest: Effort normal.  Lymphadenopathy:       Left: No inguinal adenopathy present.  Neurological: He is alert and oriented to person, place, and time. Gait normal.  Skin: Skin is warm and dry.  Erythematous area around a scab that is covered in a plastic like substance consistent with liquid skin - mild induration superior to the area - no TTP - no warmth  Psychiatric: Mood, memory, affect and judgment normal.    Assessment and Plan :  Tick bite, initial encounter - Plan: doxycycline (VIBRA-TABS) 100 MG tablet - prophylaxis due to engorged tick therefore likely to have been attached >36h and removed within 72 h.  Gave him warning signs of all 3 types of tick disease due to his inability to identify they type of tick due to engorgment.    Benny LennertSarah Weber PA-C  Primary Care at [facility] 05/12/2017 4:37 PM

## 2017-05-12 NOTE — Patient Instructions (Addendum)
Tick Bite Information Introduction Ticks are insects that attach themselves to the skin. There are many types of ticks. Common types include wood ticks and deer ticks. Sometimes, ticks carry diseases that can make a person very ill. The most common places for ticks to attach themselves are the scalp, neck, armpits, waist, and groin. HOW CAN YOU PREVENT TICK BITES? Take these steps to help prevent tick bites when you are outdoors:  Wear long sleeves and long pants.  Wear white clothes so you can see ticks more easily.  Tuck your pant legs into your socks.  If walking on a trail, stay in the middle of the trail to avoid brushing against bushes.  Avoid walking through areas with long grass.  Put bug spray on all skin that is showing and along boot tops, pant legs, and sleeve cuffs.  Check clothes, hair, and skin often and before going inside.  Brush off any ticks that are not attached.  Take a shower or bath as soon as possible after being outdoors. HOW SHOULD YOU REMOVE A TICK? Ticks should be removed as soon as possible to help prevent diseases. 1. If latex gloves are available, put them on before trying to remove a tick. 2. Use tweezers to grasp the tick as close to the skin as possible. You may also use curved forceps or a tick removal tool. Grasp the tick as close to its head as possible. Avoid grasping the tick on its body. 3. Pull gently upward until the tick lets go. Do not twist the tick or jerk it suddenly. This may break off the tick's head or mouth parts. 4. Do not squeeze or crush the tick's body. This could force disease-carrying fluids from the tick into your body. 5. After the tick is removed, wash the bite area and your hands with soap and water or alcohol. 6. Apply a small amount of antiseptic cream or ointment to the bite site. 7. Wash any tools that were used. Do not try to remove a tick by applying a hot match, petroleum jelly, or fingernail polish to the tick. These  methods do not work. They may also increase the chances of disease being spread from the tick bite. WHEN SHOULD YOU SEEK HELP? Contact your health care provider if you are unable to remove a tick or if a part of the tick breaks off in the skin. After a tick bite, you need to watch for signs and symptoms of diseases that can be spread by ticks. Contact your health care provider if you develop any of the following:  Fever.  Rash.  Redness and puffiness (swelling) in the area of the tick bite.  Tender, puffy lymph glands.  Watery poop (diarrhea).  Weight loss.  Cough.  Feeling more tired than normal (fatigue).  Muscle, joint, or bone pain.  Belly (abdominal) pain.  Headache.  Change in your level of consciousness.  Trouble walking or moving your legs.  Loss of feeling (numbness) in the legs.  Loss of movement (paralysis).  Shortness of breath.  Confusion.  Throwing up (vomiting) many times. This information is not intended to replace advice given to you by your health care provider. Make sure you discuss any questions you have with your health care provider. Document Released: 03/04/2010 Document Revised: 05/15/2016 Document Reviewed: 05/18/2013 Elsevier Interactive Patient Education  2017 Elsevier Inc.     IF you received an x-ray today, you will receive an invoice from [facility]. Please contact [facility] at [phone]   with questions or concerns regarding your invoice.   IF you received labwork today, you will receive an invoice from [facility]. Please contact [facility] at [phone] with questions or concerns regarding your invoice.   Our billing staff will not be able to assist you with questions regarding bills from these companies.  You will be contacted with the lab results as soon as they are available. The fastest way to get your results is to activate your My Chart account. Instructions are located on the last page of this paperwork.  If you have not heard from us regarding the results in 2 weeks, please contact this office.      

## 2017-05-16 IMAGING — US US ABDOMEN COMPLETE W/ ELASTOGRAPHY
1 series · 13 of 13 positions shown · non-contrast
Comparison: 09/11/2010

CLINICAL DATA: Chronic hepatitis-C.



[Series 1: us abdomen complete w/ elastography · 0.18mm/px · 13 of 13 slices shown]
[im 1/13]
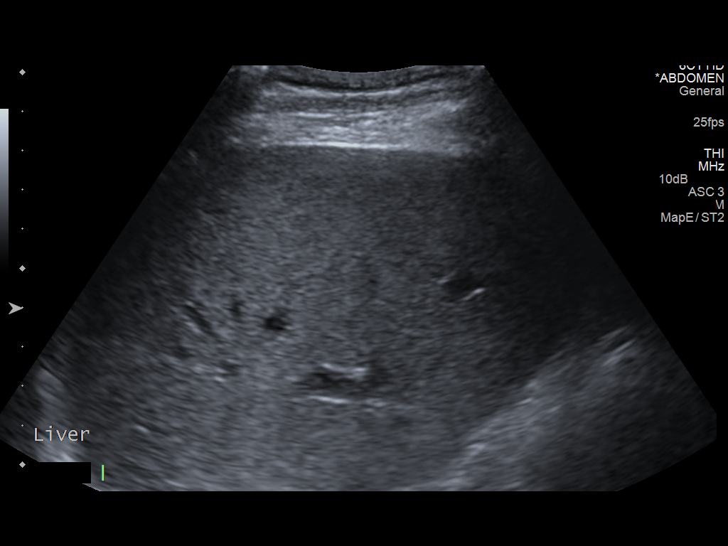
[im 2/13]
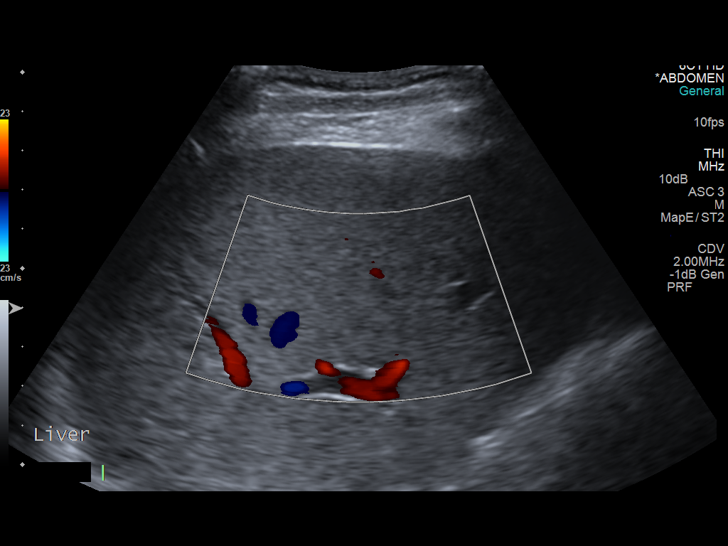
[im 3/13]
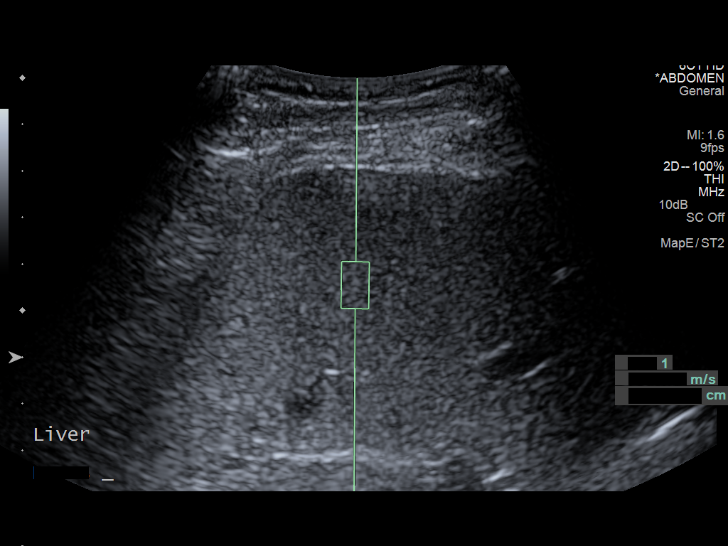
[im 4/13]
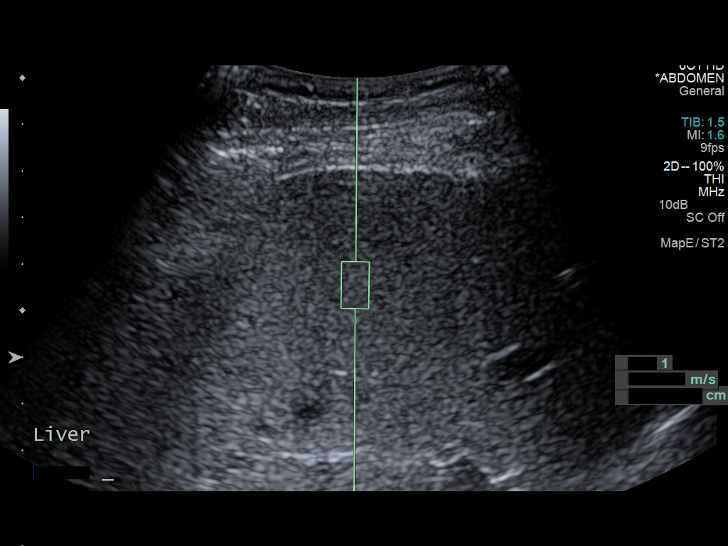
[im 5/13]
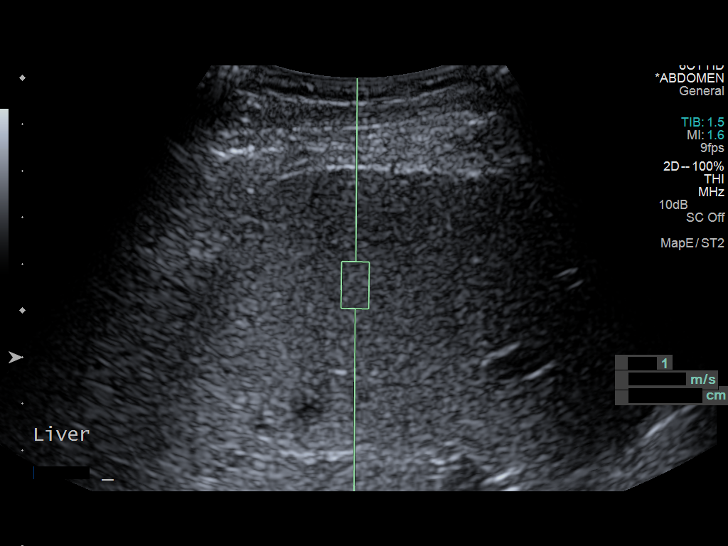
[im 6/13]
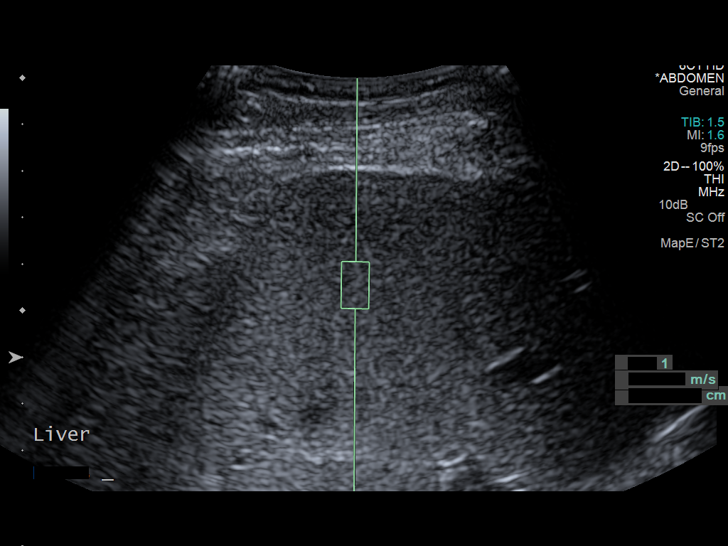
[im 7/13]
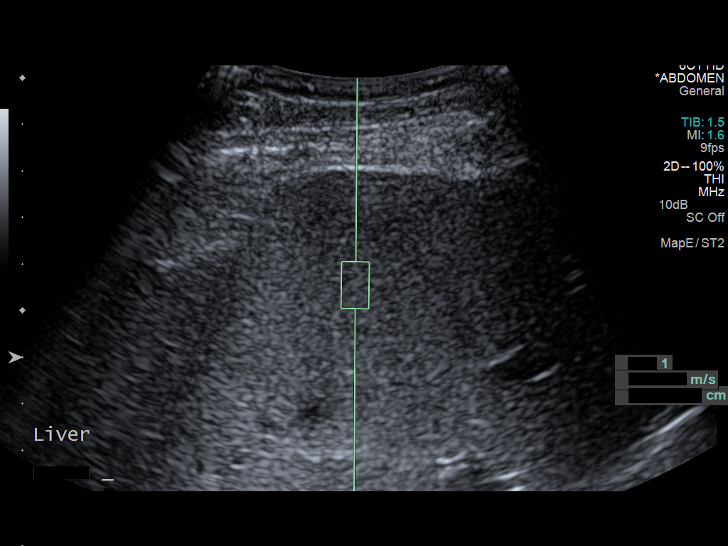
[im 8/13]
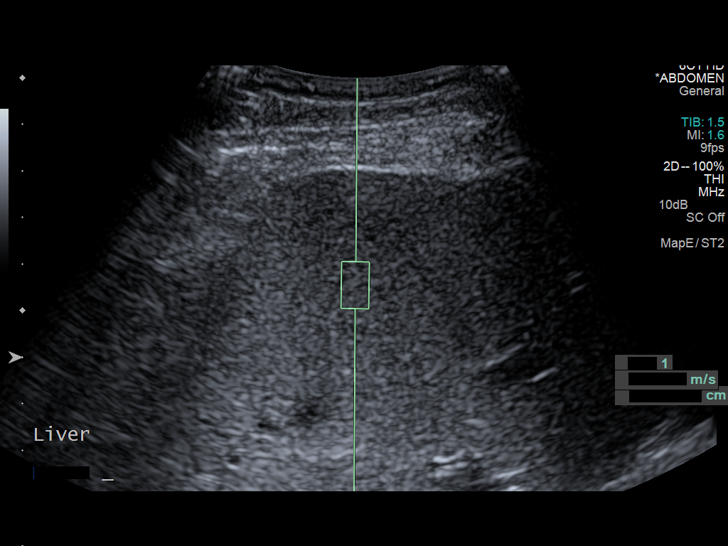
[im 9/13]
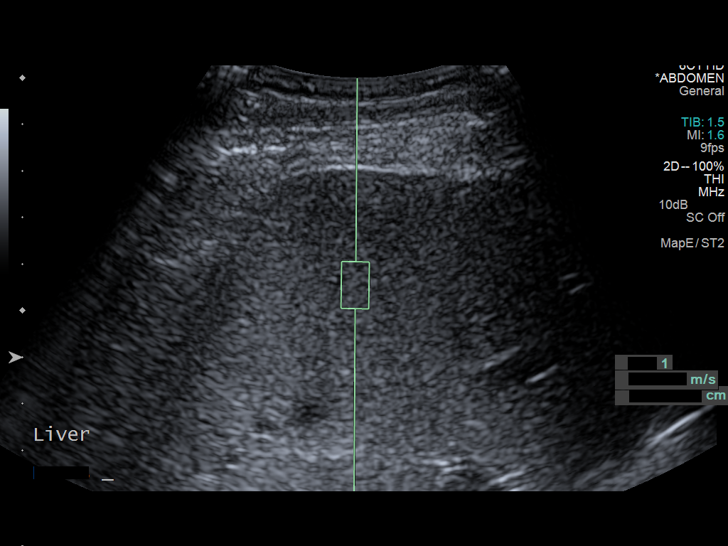
[im 10/13]
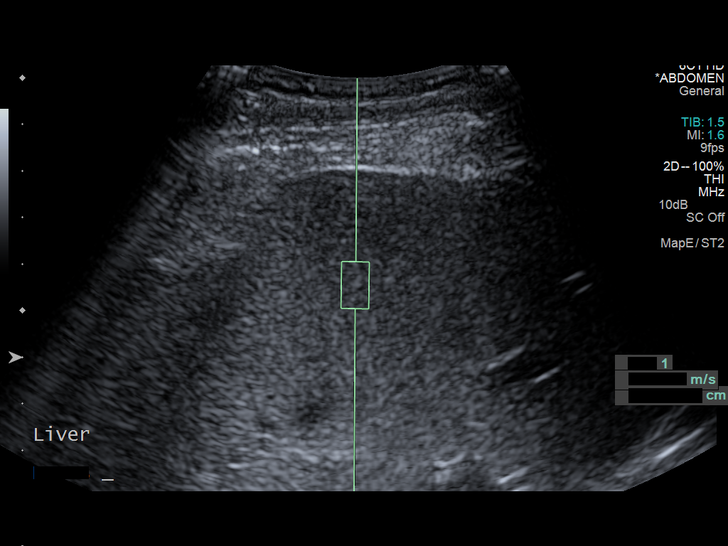
[im 11/13]
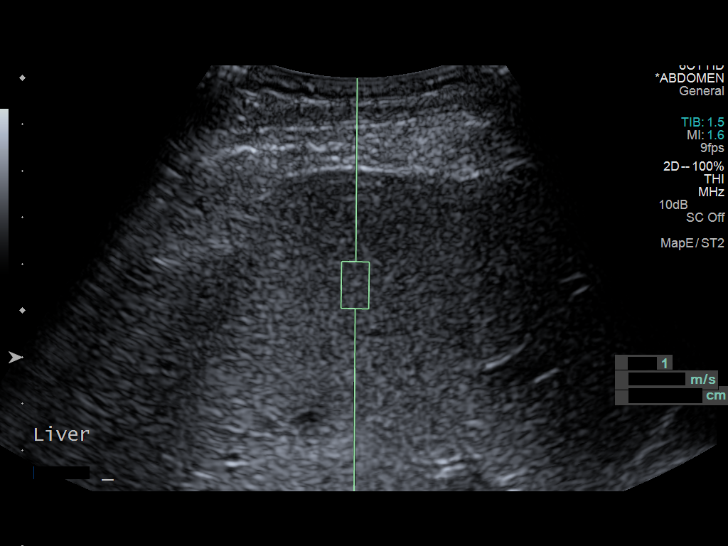
[im 12/13]
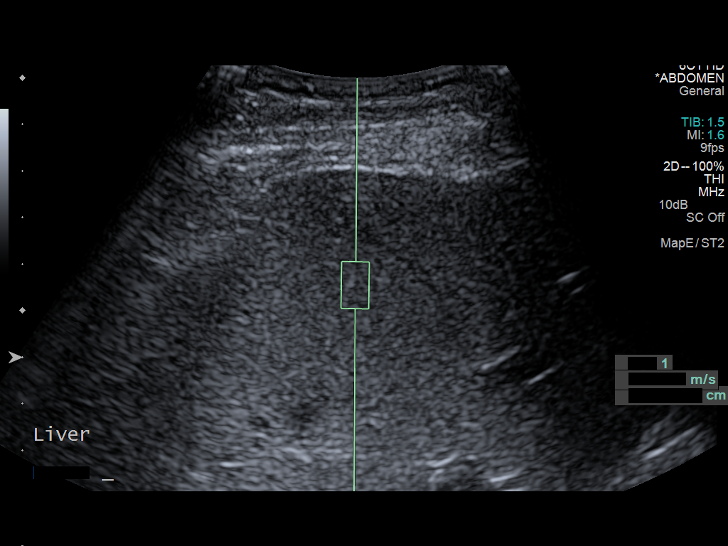
[im 13/13]
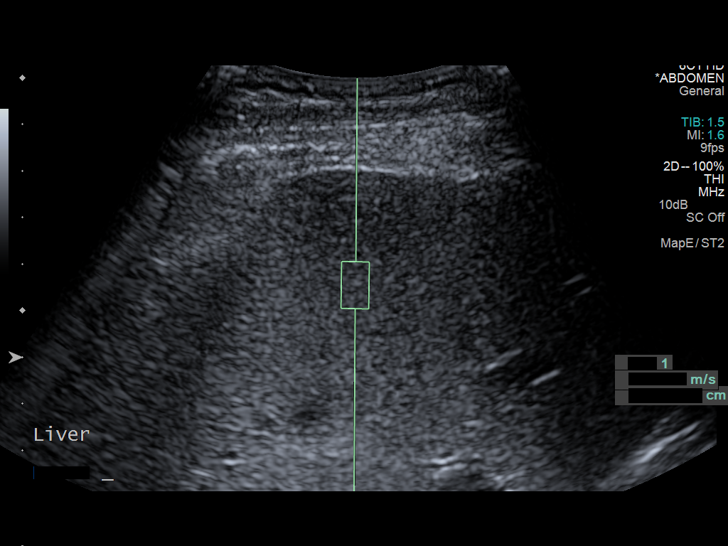

[13 of 13 positions shown; findings below may reference images not displayed]

FINDINGS: ULTRASOUND ABDOMEN

Gallbladder: No gallstones or wall thickening visualized. No
sonographic Murphy sign noted. 4 mm polyp noted.

Common bile duct: Diameter: 4 mm

Liver: No focal lesion identified. Within normal limits in
parenchymal echogenicity.

IVC: No abnormality visualized.

Pancreas: Visualized portion unremarkable.

Spleen: Size and appearance within normal limits.

Right Kidney: Length: 12.8 cm. Cyst within the upper pole measures
1.5 cm. This appears to contain thin internal areas of septation. No
mass or hydronephrosis.

Left Kidney: Length: 13.1 cm. Echogenicity within normal limits. No
mass or hydronephrosis visualized.

Abdominal aorta: No aneurysm visualized.

Other findings: None.

ULTRASOUND HEPATIC ELASTOGRAPHY

Device: Siemens Helix VTQ

Transducer [id number]

Patient position: Left lateral decubitus

Number of measurements:  10

Hepatic Segment:  8

Median velocity:   1.13  m/sec

IQR:

IQR/Median velocity ratio

Corresponding Metavir fibrosis score:  F 0/F 1

Risk of fibrosis: Low

Limitations of exam: None

Pertinent findings noted on other imaging exams:  None

Please note that abnormal shear wave velocities may also be
identified in clinical settings other than with hepatic fibrosis,
such as: acute hepatitis, elevated right heart and central venous
pressures including use of beta blockers, [place] disease
(Christian Damian), infiltrative processes such as
mastocytosis/amyloidosis/infiltrative tumor, extrahepatic
cholestasis, in the post-prandial state, and liver transplantation.
Correlation with patient history, laboratory data, and clinical
condition recommended.
IMPRESSION: 1. Mildly complex cyst is identified within the upper pole the right
kidney which measures 1.5 cm. This is increased in size from CT from
0611. Recommend followup with renal protocol MRI or CT.

Median hepatic shear wave velocity is calculated at 1.13 m/sec.

Corresponding Metavir fibrosis score is F 0/F 1.

Risk of fibrosis is minimal.

Follow-up:  None

## 2017-06-08 ENCOUNTER — Telehealth: Payer: Self-pay

## 2017-06-08 DIAGNOSIS — F319 Bipolar disorder, unspecified: Secondary | ICD-10-CM

## 2017-06-08 MED ORDER — VENLAFAXINE HCL ER 75 MG PO CP24: 150.0000 mg | ORAL_CAPSULE | Freq: Every day | ORAL | 0 refills | Status: DC

## 2017-06-08 NOTE — Telephone Encounter (Signed)
Patient given 90 day supply, follow up in 3 months.

## 2017-06-08 NOTE — Telephone Encounter (Signed)
This pt is currently in the office and would like a refill on his Effexor -XR 75 mg he takes 150mg  daily. He was given a 90 days supply on 01/17/17 and he had is CPE on 01/24/17. Can pt get a refill? Pls Advise

## 2017-07-17 ENCOUNTER — Encounter: Payer: Self-pay | Admitting: Urgent Care

## 2017-07-17 ENCOUNTER — Ambulatory Visit (INDEPENDENT_AMBULATORY_CARE_PROVIDER_SITE_OTHER): Payer: BLUE CROSS/BLUE SHIELD | Admitting: Urgent Care

## 2017-07-17 VITALS — BP 134/92 | HR 106 | Temp 98.3°F | Resp 16 | Ht 65.0 in | Wt 151.6 lb

## 2017-07-17 DIAGNOSIS — F1190 Opioid use, unspecified, uncomplicated: Secondary | ICD-10-CM | POA: Diagnosis not present

## 2017-07-17 DIAGNOSIS — F319 Bipolar disorder, unspecified: Secondary | ICD-10-CM | POA: Diagnosis not present

## 2017-07-17 DIAGNOSIS — F1410 Cocaine abuse, uncomplicated: Secondary | ICD-10-CM

## 2017-07-17 DIAGNOSIS — R454 Irritability and anger: Secondary | ICD-10-CM

## 2017-07-17 LAB — POCT URINALYSIS DIP (MANUAL ENTRY)
Bilirubin, UA: NEGATIVE
Blood, UA: NEGATIVE
Glucose, UA: NEGATIVE mg/dL
Ketones, POC UA: NEGATIVE mg/dL
Leukocytes, UA: NEGATIVE
Nitrite, UA: NEGATIVE
Protein Ur, POC: NEGATIVE mg/dL
Spec Grav, UA: 1.01
Urobilinogen, UA: 0.2 U/dL
pH, UA: 7

## 2017-07-17 MED ORDER — OXCARBAZEPINE 300 MG PO TABS: 300.0000 mg | ORAL_TABLET | Freq: Two times a day (BID) | ORAL | 0 refills | Status: AC

## 2017-07-17 MED ORDER — VENLAFAXINE HCL ER 75 MG PO CP24: 150.0000 mg | ORAL_CAPSULE | Freq: Every day | ORAL | 0 refills | Status: AC

## 2017-07-17 MED ORDER — RISPERIDONE 0.5 MG PO TABS: 0.5000 mg | ORAL_TABLET | Freq: Every day | ORAL | 1 refills | Status: AC

## 2017-07-17 NOTE — Patient Instructions (Addendum)
[facility] has intensive outpatient rehab for alcohol and substance abuse. They meet 4 times weekly. Monday, Wednesday, Friday are group therapy. The times are 9:00-12:00, 12:00-2:00, 6:30-9:00 - do not cross over groups. Tuesday nights are family nights, bring a support person 6:30-9:00. Needs an assessment with clinical director, Dr. Cheyenne Adasinger. They have appointments available on 07/28/2017 for an evaluation with Dr. Cheyenne Adasinger.   In the meantime, you can have an office visit with me any day that I am working. If there are no appointments available for me, then come to the clinic and let them know that I asked to see you regardless to help you bridge your outpatient therapy for substance abuse. Please make sure you that you let your parents know that you are attempting rehab for cocaine use.    Trileptal For the first 3 days, take 300mg  daily. Then go up to 300mg  twice daily for another 5 days. Come back for an office visit at that time an we will try to get you up to 300mg  three times daily.    Finding Treatment for Addiction What is addiction? Addiction is a complex disease of the brain. It causes an uncontrollable (compulsive) need for a substance. You can be addicted to alcohol, illegal drugs, or prescription medicines such as painkillers. Addiction can also be a behavior, like gambling or shopping. The need for the drug or activity can become so strong that you think about it all the time. You can also become physically dependent on a substance. Addiction can change the way your brain works. Because of these changes, getting more of whatever you are addicted to becomes the most important thing to you and feels better than other activities or relationships. Addiction can lead to changes in health, behavior, emotions, relationships, and choices that affect you and everyone around you. How do I know if I need treatment for addiction? Addiction is a progressive disease. Without treatment,  addiction can get worse. Living with addiction puts you at higher risk for injury, poor health, lost employment, loss of money, and even death. You might need treatment for addiction if:  You have tried to stop or cut down, but you cannot.  Your addiction is causing physical health problems.  You find it annoying that your friends and family are concerned about your alcohol or substance use.  You feel guilty about substance abuse or a compulsive behavior.  You have lied or tried to hide your addiction.  You need a particular substance or activity to start your day or to calm down.  You are getting in trouble at school, work, home, or with the police.  You have done something illegal to support your addiction.  You are running out of money because of your addiction.  You have no time for anything other than your addiction.  What types of treatment are available? The treatment program that is right for you will depend on many factors, including the type of addiction you have. Treatment programs can be outpatient or inpatient. In an outpatient program, you live at home and go to work or school, but you also go to a clinic for treatment. With an inpatient program, you live and sleep at the program facility during treatment. After treatment, you might need a plan for support during recovery. Other treatment options include:  Medicine. ? Some addictions may be treated with prescription medicines. ? You might also need medicine to treat anxiety or depression.  Counseling and behavior therapy. Therapy can help  individuals and families behave in healthier ways and relate more effectively.  Support groups. Confidential group therapy, such as a 12-step program, can help individuals and families during treatment and recovery.  No single type of program is right for everyone. Many treatment programs involve a combination of education, counseling, and a 12-step, spiritually-based approach. Some  treatment programs are government sponsored. They are geared for patients who do not have private insurance. Treatment programs can vary in many respects, such as:  Cost and types of insurance that are accepted.  Types of on-site medical services that are offered.  Length of stay, setting, and size.  Overall philosophy of treatment.  What should I consider when selecting a treatment program? It is important to think about your individual requirements when selecting a treatment program. There are a number of things to consider, such as:  If the program is certified by the appropriate government agency. Even private programs must be certified and employ certified professionals.  If the program is covered by your insurance. If finances are a concern, the first call you should make is to your insurance company, if you have health insurance. Ask for a list of treatment programs that are in your network, and confirm any copayments and deductibles that you may have to pay. ? If you do not have insurance, or if you choose to attend a program that does not accept your insurance, discuss whether a payment plan can be set up.  If treatment is available in languages other than English, if needed.  If the program offers detoxification treatment, if needed.  If 12-step meetings are held at the center or if transport is available for patients to attend meetings at other locations.  If the program is professional, organized, and clean.  If the program meets all of your needs, including physical and cultural needs.  If the facility offers specific treatment for your particular addiction.  If support continues to be offered after you have left the program.  If your treatment plan is continually looked at to make sure you are receiving the right treatment at the right time.  If mental health counseling is part of your treatment.  If medicine is included in treatment, if needed.  If your family is  included in your treatment plan and if support is offered to them throughout the treatment process.  How the treatment works to prevent relapse.  Where else can I get help?  Your health care provider. Ask him or her to help you find addiction treatment. These discussions are confidential.  The [employer] on Alcoholism and Drug Dependence (NCADD). This group has information about treatment centers and programs for people who have an addiction and for family members. ? The telephone number is 1-800-NCA-CALL ((779) 279-5028). ? The website is https://ncadd.org/about-ncadd/our-affiliates  The Substance Abuse and Mental Health Services Administration [facility]). This group will help you find publicly funded treatment centers, help hotlines, and counseling services near you. ? The telephone number is 1-800-662-HELP (972 126 0396). ? The website is www.findtreatment.[employer].pl In countries outside of the [place]. and [place], look in [employer] for contact information for services in your area. This information is not intended to replace advice given to you by your health care provider. Make sure you discuss any questions you have with your health care provider. Document Released: 11/06/2005 Document Revised: 11/03/2016 Document Reviewed: 09/26/2014 Elsevier Interactive Patient Education  2017 [employer].     IF you received an x-ray today, you will receive an invoice from  [facility]. Please contact [facility] at [phone] with questions or concerns regarding your invoice.   IF you received labwork today, you will receive an invoice from [facility]. Please contact [facility] at [phone] with questions or concerns regarding your invoice.   Our billing staff will not be able to assist you with questions regarding bills from these companies.  You will be contacted with the lab results as soon as they are available. The fastest way to get your results is to  activate your My Chart account. Instructions are located on the last page of this paperwork. If you have not heard from us regarding the results in 2 weeks, please contact this office.
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
## 2017-07-18 LAB — COMPREHENSIVE METABOLIC PANEL
A/G RATIO: 1.8 (ref 1.2–2.2)
ALT: 17 IU/L (ref 0–44)
AST: 14 IU/L (ref 0–40)
Albumin: 4.4 g/dL (ref 3.5–5.5)
Alkaline Phosphatase: 97 IU/L (ref 39–117)
BUN/Creatinine Ratio: 7 — ABNORMAL LOW (ref 9–20)
BUN: 6 mg/dL (ref 6–24)
Bilirubin Total: 0.4 mg/dL (ref 0.0–1.2)
CALCIUM: 10 mg/dL (ref 8.7–10.2)
CO2: 28 mmol/L (ref 20–29)
CREATININE: 0.82 mg/dL (ref 0.76–1.27)
Chloride: 101 mmol/L (ref 96–106)
GFR, EST AFRICAN AMERICAN: 123 mL/min/{1.73_m2} (ref >59)
GFR, EST NON AFRICAN AMERICAN: 107 mL/min/{1.73_m2} (ref >59)
Globulin, Total: 2.5 g/dL (ref 1.5–4.5)
Glucose: 96 mg/dL (ref 65–99)
Potassium: 4.6 mmol/L (ref 3.5–5.2)
Sodium: 144 mmol/L (ref 134–144)
TOTAL PROTEIN: 6.9 g/dL (ref 6.0–8.5)

## 2017-07-20 ENCOUNTER — Encounter: Payer: Self-pay | Admitting: Urgent Care

## 2017-07-20 ENCOUNTER — Ambulatory Visit (INDEPENDENT_AMBULATORY_CARE_PROVIDER_SITE_OTHER): Payer: BLUE CROSS/BLUE SHIELD | Admitting: Urgent Care

## 2017-07-20 VITALS — BP 143/85 | HR 106 | Temp 98.0°F | Resp 18 | Ht 64.96 in | Wt 152.0 lb

## 2017-07-20 DIAGNOSIS — R454 Irritability and anger: Secondary | ICD-10-CM

## 2017-07-20 DIAGNOSIS — F319 Bipolar disorder, unspecified: Secondary | ICD-10-CM

## 2017-07-20 DIAGNOSIS — F1410 Cocaine abuse, uncomplicated: Secondary | ICD-10-CM

## 2017-07-20 DIAGNOSIS — F1190 Opioid use, unspecified, uncomplicated: Secondary | ICD-10-CM

## 2017-07-20 NOTE — Progress Notes (Signed)
    MRN: [id number] DOB: 05/13/1971  Subjective:   Bobby Morrison is a 46 y.o. male presenting for follow up on substance use. Patient has spoken with his parents to reach for support on his cocaine use. He has not used cocaine again since Friday, 07/17/2017. He feels very low in energy and his mood is down. He has not yet contacted the [facility]. Unfortunately, patient had "somewhere to be by 17:00" and we did not complete an office visit.   Bobby Morrison has a current medication list which includes the following prescription(s): atorvastatin, ibuprofen, oxcarbazepine, risperidone, valacyclovir, and venlafaxine xr. Also is allergic to bee venom.  Bobby Morrison  has a past medical history of Cocaine abuse; Diverticulitis; Mood disorder (HCC); and Substance abuse. Also  has a past surgical history that includes Colostomy.  Objective:   Vitals: BP (!) 143/85   Pulse (!) 106   Temp 98 F (36.7 C) (Oral)   Resp 18   Ht 5' 4.96" (1.65 m)   Wt 152 lb (68.9 kg)   SpO2 97%   BMI 25.32 kg/m   Physical Exam  Constitutional: He is oriented to person, place, and time. He appears well-developed and well-nourished.  Cardiovascular: Normal rate.   Pulmonary/Chest: Effort normal.  Neurological: He is alert and oriented to person, place, and time.  Psychiatric: His mood appears not anxious. His affect is not angry, not blunt, not labile and not inappropriate. His speech is not rapid and/or pressured, not tangential and not slurred. He exhibits a depressed mood (flat affect).   Assessment and Plan :   1. Bipolar affective disorder, remission status unspecified (HCC) 2. Cocaine abuse 3. Heroin use 4. Irritability and anger - I recommended patient contact [facility]. I was unable to ask patient about his medications for his bipolar disorder since he left. I offered to see patient the rest of the week that I am working. Patient verbalized understanding.  Wallis BambergMario Alassane Kalafut, PA-C [facility] [phone] 07/20/2017 4:43 PM

## 2017-07-20 NOTE — Patient Instructions (Signed)
     IF you received an x-ray today, you will receive an invoice from [facility]. Please contact [facility] at [phone] with questions or concerns regarding your invoice.   IF you received labwork today, you will receive an invoice from [facility]. Please contact [facility] at [phone] with questions or concerns regarding your invoice.   Our billing staff will not be able to assist you with questions regarding bills from these companies.  You will be contacted with the lab results as soon as they are available. The fastest way to get your results is to activate your My Chart account. Instructions are located on the last page of this paperwork. If you have not heard from us regarding the results in 2 weeks, please contact this office.     

## 2017-07-24 ENCOUNTER — Telehealth: Payer: Self-pay | Admitting: Urgent Care

## 2017-07-24 NOTE — Telephone Encounter (Signed)
Patient read about amantadine and wanted information about taking this for cocaine use. He is still having a hard time not using. He has contacted his parents and let them know he needs their support. He is also looking into rehab, is calling places daily to see if they have availability. He let his boss know that he plans on getting help for his cocaine abuse. He did restart his psych medications as prescribed. I counseled that I do not want to use amantadine, I am not familiar with it and do not know of any help it could give with his withdrawal symptoms for cocaine abuse. Patient will come into clinic for follow up with me when he is able to.

## 2017-07-24 NOTE — Telephone Encounter (Signed)
Please advise 

## 2017-07-24 NOTE — Telephone Encounter (Signed)
PATIENT WANTS THIS MESSAGE TO GO TO [facility] IMMEDIATELY. HE SAID MARIO KNOWS HIM BY NAME. HE SAID HE SAW SOMETHING IN A BOOK AND HE WANTS TO TALK WITH HIM ABOUT IT. (I EXPLAINED OUR PHONE POLICY AND PATIENT BECAME VERY AGITATED). BEST PHONE [phone] (CELL) MBC
